# Patient Record
Sex: Female | Born: 1945 | Race: White | Hispanic: No
Health system: Southern US, Community
[De-identification: ages and names within clinical notes are randomized; demographics above are authoritative.]

---

## 2010-01-06 ENCOUNTER — Inpatient Hospital Stay: Admission: RE | Admit: 2010-01-06 | Discharge: 2010-01-27 | Payer: Self-pay | Admitting: Internal Medicine

## 2010-10-24 LAB — COMPREHENSIVE METABOLIC PANEL
ALT: 14 U/L (ref 0–35)
Creatinine, Ser: 0.45 mg/dL (ref 0.4–1.2)
Potassium: 4 mEq/L (ref 3.5–5.1)

## 2010-10-24 LAB — CBC
HCT: 28.9 % — ABNORMAL LOW (ref 36.0–46.0)
MCHC: 32.9 g/dL (ref 30.0–36.0)
MCV: 94.3 fL (ref 78.0–100.0)
RDW: 18.6 % — ABNORMAL HIGH (ref 11.5–15.5)

## 2010-10-24 LAB — BASIC METABOLIC PANEL
BUN: 17 mg/dL (ref 6–23)
Calcium: 9.7 mg/dL (ref 8.4–10.5)
GFR calc Af Amer: >60 mL/min (ref >60)
GFR calc non Af Amer: >60 mL/min (ref >60)
Sodium: 135 mEq/L (ref 135–145)

## 2010-10-24 LAB — PHOSPHORUS: Phosphorus: 4.6 mg/dL (ref 2.3–4.6)

## 2010-10-24 LAB — THEOPHYLLINE LEVEL: Theophylline Lvl: 7.6 ug/mL — ABNORMAL LOW (ref 10.0–20.0)

## 2010-10-24 LAB — DIFFERENTIAL
Eosinophils Absolute: 0.1 10*3/uL (ref 0.0–0.7)
Lymphs Abs: 1 10*3/uL (ref 0.7–4.0)
Monocytes Absolute: 0.6 10*3/uL (ref 0.1–1.0)
Neutrophils Relative %: 60 % (ref 43–77)

## 2010-10-25 LAB — DIFFERENTIAL
Basophils Absolute: 0 10*3/uL (ref 0.0–0.1)
Eosinophils Relative: 0 % (ref 0–5)
Lymphocytes Relative: 2 % — ABNORMAL LOW (ref 12–46)
Lymphocytes Relative: 9 % — ABNORMAL LOW (ref 12–46)
Lymphs Abs: 0.8 10*3/uL (ref 0.7–4.0)
Monocytes Relative: 3 % (ref 3–12)
Neutrophils Relative %: 75 % (ref 43–77)

## 2010-10-25 LAB — CBC
Hemoglobin: 9.2 g/dL — ABNORMAL LOW (ref 12.0–15.0)
MCHC: 32.6 g/dL (ref 30.0–36.0)
MCHC: 33.1 g/dL (ref 30.0–36.0)
MCV: 90.3 fL (ref 78.0–100.0)
MCV: 90.3 fL (ref 78.0–100.0)
Platelets: 417 10*3/uL — ABNORMAL HIGH (ref 150–400)
RBC: 3.1 MIL/uL — ABNORMAL LOW (ref 3.87–5.11)
RBC: 3.56 MIL/uL — ABNORMAL LOW (ref 3.87–5.11)
WBC: 16.1 10*3/uL — ABNORMAL HIGH (ref 4.0–10.5)

## 2010-10-25 LAB — BLOOD GAS, ARTERIAL
Bicarbonate: 39.9 mEq/L — ABNORMAL HIGH (ref 20.0–24.0)
O2 Content: 2.5 L/min
O2 Saturation: 95.5 %
Patient temperature: 98.6
TCO2: 41.8 mmol/L (ref 0–100)
pCO2 arterial: 62.9 mmHg (ref 35.0–45.0)

## 2010-10-25 LAB — COMPREHENSIVE METABOLIC PANEL
AST: 20 U/L (ref 0–37)
Alkaline Phosphatase: 38 U/L — ABNORMAL LOW (ref 39–117)
CO2: 39 mEq/L — ABNORMAL HIGH (ref 19–32)
CO2: 40 mEq/L — ABNORMAL HIGH (ref 19–32)
Calcium: 8.4 mg/dL (ref 8.4–10.5)
Calcium: 9.2 mg/dL (ref 8.4–10.5)
Chloride: 88 mEq/L — ABNORMAL LOW (ref 96–112)
Creatinine, Ser: 0.3 mg/dL — ABNORMAL LOW (ref 0.4–1.2)
Creatinine, Ser: 0.38 mg/dL — ABNORMAL LOW (ref 0.4–1.2)
GFR calc non Af Amer: >60 mL/min (ref >60)
GFR calc non Af Amer: >60 mL/min (ref >60)
Glucose, Bld: 129 mg/dL — ABNORMAL HIGH (ref 70–99)
Glucose, Bld: 95 mg/dL (ref 70–99)
Sodium: 132 mEq/L — ABNORMAL LOW (ref 135–145)

## 2010-10-25 LAB — CREATININE, SERUM: Creatinine, Ser: <0.3 mg/dL — ABNORMAL LOW (ref 0.4–1.2)

## 2010-10-25 LAB — BASIC METABOLIC PANEL
BUN: 14 mg/dL (ref 6–23)
CO2: 35 mEq/L — ABNORMAL HIGH (ref 19–32)
Chloride: 90 mEq/L — ABNORMAL LOW (ref 96–112)
Creatinine, Ser: 0.36 mg/dL — ABNORMAL LOW (ref 0.4–1.2)
GFR calc Af Amer: >60 mL/min (ref >60)

## 2010-10-25 LAB — RETICULOCYTES: RBC.: 3.71 MIL/uL — ABNORMAL LOW (ref 3.87–5.11)

## 2010-10-25 LAB — IRON AND TIBC
Saturation Ratios: 18 % — ABNORMAL LOW (ref 20–55)
TIBC: 298 ug/dL (ref 250–470)

## 2010-10-25 LAB — VITAMIN D 1,25 DIHYDROXY: Vitamin D2 1, 25 (OH)2: <8 pg/mL

## 2010-10-25 LAB — MAGNESIUM: Magnesium: 1.6 mg/dL (ref 1.5–2.5)

## 2010-10-25 LAB — NASAL CULTURE (N/P): Culture: NORMAL

## 2010-10-25 LAB — PREALBUMIN: Prealbumin: 19 mg/dL (ref 18.0–45.0)

## 2010-10-25 LAB — VITAMIN B12: Vitamin B-12: 968 pg/mL — ABNORMAL HIGH (ref 211–911)

## 2011-10-31 ENCOUNTER — Inpatient Hospital Stay: Admission: AD | Admit: 2011-10-31 | Payer: Self-pay | Source: Ambulatory Visit | Admitting: Internal Medicine

## 2012-09-26 ENCOUNTER — Inpatient Hospital Stay
Admission: AD | Admit: 2012-09-26 | Discharge: 2012-11-07 | Disposition: A | Discharge status: Skilled Nursing Facility | Payer: Self-pay | Source: Ambulatory Visit | Attending: Internal Medicine | Admitting: Internal Medicine

## 2012-09-26 DIAGNOSIS — J189 Pneumonia, unspecified organism: Secondary | ICD-10-CM

## 2012-09-26 DIAGNOSIS — J439 Emphysema, unspecified: Secondary | ICD-10-CM

## 2012-09-26 DIAGNOSIS — J9620 Acute and chronic respiratory failure, unspecified whether with hypoxia or hypercapnia: Secondary | ICD-10-CM

## 2012-09-26 DIAGNOSIS — J9801 Acute bronchospasm: Secondary | ICD-10-CM

## 2012-09-26 DIAGNOSIS — Z930 Tracheostomy status: Secondary | ICD-10-CM

## 2012-09-26 DIAGNOSIS — R64 Cachexia: Secondary | ICD-10-CM

## 2012-09-27 LAB — COMPREHENSIVE METABOLIC PANEL
ALT: 9 U/L (ref 0–35)
AST: 9 U/L (ref 0–37)
Albumin: 3 g/dL — ABNORMAL LOW (ref 3.5–5.2)
Alkaline Phosphatase: 56 U/L (ref 39–117)
BUN: 16 mg/dL (ref 6–23)
Chloride: 97 mEq/L (ref 96–112)
Potassium: 3.9 mEq/L (ref 3.5–5.1)
Sodium: 144 mEq/L (ref 135–145)
Total Bilirubin: 0.2 mg/dL — ABNORMAL LOW (ref 0.3–1.2)

## 2012-09-27 LAB — SEDIMENTATION RATE: Sed Rate: 30 mm/hr — ABNORMAL HIGH (ref 0–22)

## 2012-09-27 LAB — CBC
HCT: 39.1 % (ref 36.0–46.0)
Platelets: 327 10*3/uL (ref 150–400)
RDW: 14.3 % (ref 11.5–15.5)
WBC: 10.6 10*3/uL — ABNORMAL HIGH (ref 4.0–10.5)

## 2012-09-27 LAB — PREALBUMIN: Prealbumin: 17 mg/dL — ABNORMAL LOW (ref 17.0–34.0)

## 2012-09-28 ENCOUNTER — Other Ambulatory Visit (HOSPITAL_COMMUNITY): Payer: Self-pay

## 2012-09-29 LAB — CBC
HCT: 38.9 % (ref 36.0–46.0)
MCHC: 29.6 g/dL — ABNORMAL LOW (ref 30.0–36.0)
MCV: 99.7 fL (ref 78.0–100.0)
RDW: 14.4 % (ref 11.5–15.5)

## 2012-09-29 LAB — BASIC METABOLIC PANEL
BUN: 12 mg/dL (ref 6–23)
Calcium: 9.6 mg/dL (ref 8.4–10.5)
Creatinine, Ser: 0.26 mg/dL — ABNORMAL LOW (ref 0.50–1.10)
GFR calc Af Amer: >90 mL/min (ref >90)
GFR calc non Af Amer: >90 mL/min (ref >90)

## 2012-09-30 LAB — BASIC METABOLIC PANEL
Calcium: 9.3 mg/dL (ref 8.4–10.5)
Creatinine, Ser: 0.33 mg/dL — ABNORMAL LOW (ref 0.50–1.10)
GFR calc Af Amer: >90 mL/min (ref >90)

## 2012-09-30 LAB — PROCALCITONIN: Procalcitonin: <0.1 ng/mL

## 2012-10-01 LAB — BASIC METABOLIC PANEL
CO2: 39 mEq/L — ABNORMAL HIGH (ref 19–32)
Calcium: 9.4 mg/dL (ref 8.4–10.5)
Creatinine, Ser: 0.33 mg/dL — ABNORMAL LOW (ref 0.50–1.10)
GFR calc Af Amer: >90 mL/min (ref >90)

## 2012-10-01 LAB — CBC
MCV: 101.3 fL — ABNORMAL HIGH (ref 78.0–100.0)
Platelets: 330 10*3/uL (ref 150–400)
RDW: 14.6 % (ref 11.5–15.5)
WBC: 9.8 10*3/uL (ref 4.0–10.5)

## 2012-10-03 ENCOUNTER — Other Ambulatory Visit (HOSPITAL_COMMUNITY): Payer: Self-pay

## 2012-10-03 DIAGNOSIS — J438 Other emphysema: Secondary | ICD-10-CM

## 2012-10-03 DIAGNOSIS — J9801 Acute bronchospasm: Secondary | ICD-10-CM

## 2012-10-03 DIAGNOSIS — R64 Cachexia: Secondary | ICD-10-CM

## 2012-10-03 DIAGNOSIS — J9620 Acute and chronic respiratory failure, unspecified whether with hypoxia or hypercapnia: Secondary | ICD-10-CM | POA: Diagnosis present

## 2012-10-03 DIAGNOSIS — J189 Pneumonia, unspecified organism: Secondary | ICD-10-CM

## 2012-10-03 DIAGNOSIS — J439 Emphysema, unspecified: Secondary | ICD-10-CM | POA: Diagnosis present

## 2012-10-03 LAB — BASIC METABOLIC PANEL
CO2: 38 mEq/L — ABNORMAL HIGH (ref 19–32)
Calcium: 9.1 mg/dL (ref 8.4–10.5)
GFR calc Af Amer: 82 mL/min — ABNORMAL LOW (ref >90)
GFR calc non Af Amer: 71 mL/min — ABNORMAL LOW (ref >90)
Sodium: 134 mEq/L — ABNORMAL LOW (ref 135–145)

## 2012-10-03 LAB — DIFFERENTIAL
Eosinophils Relative: 0 % (ref 0–5)
Lymphocytes Relative: 2 % — ABNORMAL LOW (ref 12–46)
Monocytes Relative: 5 % (ref 3–12)
Neutrophils Relative %: 93 % — ABNORMAL HIGH (ref 43–77)
WBC Morphology: INCREASED

## 2012-10-03 LAB — BLOOD GAS, ARTERIAL
Acid-Base Excess: 1.9 mmol/L (ref 0.0–2.0)
Bicarbonate: 29.6 mEq/L — ABNORMAL HIGH (ref 20.0–24.0)
Bicarbonate: 27.4 mEq/L — ABNORMAL HIGH (ref 20.0–24.0)
FIO2: 100 %
FIO2: 0.4 %
MECHVT: 400 mL
O2 Saturation: 99.7 %
PEEP: 5 cmH2O
Patient temperature: 98.6
Patient temperature: 98.6
TCO2: 40.3 mmol/L (ref 0–100)
TCO2: 32.1 mmol/L (ref 0–100)
pCO2 arterial: 52.3 mmHg — ABNORMAL HIGH (ref 35.0–45.0)
pH, Arterial: 7.043 — CL (ref 7.350–7.450)
pO2, Arterial: 123 mmHg — ABNORMAL HIGH (ref 80.0–100.0)

## 2012-10-03 LAB — CBC
Hemoglobin: 11.5 g/dL — ABNORMAL LOW (ref 12.0–15.0)
MCHC: 29.3 g/dL — ABNORMAL LOW (ref 30.0–36.0)
MCV: 103.1 fL — ABNORMAL HIGH (ref 78.0–100.0)
Platelets: 291 10*3/uL (ref 150–400)
Platelets: 302 10*3/uL (ref 150–400)
RBC: 3.85 MIL/uL — ABNORMAL LOW (ref 3.87–5.11)
WBC: 42.4 10*3/uL — ABNORMAL HIGH (ref 4.0–10.5)

## 2012-10-03 LAB — TSH: TSH: 0.972 u[IU]/mL (ref 0.350–4.500)

## 2012-10-03 LAB — VITAMIN B12: Vitamin B-12: 1179 pg/mL — ABNORMAL HIGH (ref 211–911)

## 2012-10-03 LAB — T4, FREE: Free T4: 0.96 ng/dL (ref 0.80–1.80)

## 2012-10-03 NOTE — Consult Note (Signed)
PULMONARY/CCM NOTE  Requesting MD/Service: Hijazi Date of admission: 09/26/12 Date of consult: 10/03/12 Reason for consultation: acute on chronic respiratory failure  Brief profile: 58F with severe COPD intubated 2/26 for severe COPD with bronchospasm, new RLL HCAP with copious purulent sputum, severe hypercarbia and marked leukocytosis.    Lines, Tubes, etc: ETT 2/26 >>   Microbiology: Respiratory 2/26 >>  Blood 2/26 >>   Antibiotics:  Vanc 2/26 >>  Meropenem 2/26 >>     Best Practice: DVT: LMWH SUP: PPI Sedation/analgesia: intermittent Ativan and fentanyl    HPI:  58F with severe COPD transferred to [facility] 2/19 after hospitalization 2/10-2/19 in [place] for acute on chronic respiratory failure due to AECOPD which required intubation. This was her 3rd intubation in 3 months for the same. She was extubated prior to transfer to [facility]. During the week in this hospital, she was undergoing rehab and ambulating well. On the morning of this consultation, she was found minimally responsive with severe hypercarbia requiring intubation    PMH: Htn, GERD, anxiety, hyperlipidemia, COPD   MEDICATIONS: reviewed  SH:  100+ PY smoker. Reportedly quit in recent past. No occupational exposures of note  FH: noncontributory  ROS - N/A due to encephalopathy  Vitals reviewed  EXAM:  Gen: frail, thin to cachectic HEENT: [place]/AT, EOMI, PERRL Neck: NO JVD noted Lungs: markedly diminished BS throughout, prolonged expiratory wheezes, findings of consolidation in RLL Cardiovascular: RRR s M Abdomen: soft, nondistended, nontender EXT: Warm, 2+ DP pulses, no edema Neuro: MAEs, no focal deficits  DATA:  CXR: New R > L AS dz  BMET    Component Value Date/Time   NA 134* 10/03/2012 1000   K 6.0* 10/03/2012 1000   CL 91* 10/03/2012 1000   CO2 38* 10/03/2012 1000   GLUCOSE 146* 10/03/2012 1000   BUN 33* 10/03/2012 1000   CREATININE 0.85 10/03/2012 1000   CALCIUM 9.1 10/03/2012 1000   GFRNONAA 71* 10/03/2012 1000   GFRAA 82* 10/03/2012 1000    CBC    Component Value Date/Time   WBC 42.4* 10/03/2012 1000   RBC 3.85* 10/03/2012 1000   HGB 11.5* 10/03/2012 1000   HCT 39.7 10/03/2012 1000   PLT 302 10/03/2012 1000   MCV 103.1* 10/03/2012 1000   MCH 29.9 10/03/2012 1000   MCHC 29.0* 10/03/2012 1000   RDW 14.0 10/03/2012 1000   LYMPHSABS 0.8 10/03/2012 1000   MONOABS 2.1* 10/03/2012 1000   EOSABS 0.0 10/03/2012 1000   BASOSABS 0.0 10/03/2012 1000       IMPRESSION:   Principal Problem:   Acute-on-chronic respiratory failure Active Problems:   Emphysema/COPD, severe   HCAP, RLL   Bronchospasm   Cachexia   PLAN:  Vent settings established Bronchodilators adjusted Agree with Vanc/meropenem Resp culture ordered She will need trach tube but needs to be better stabilized before proceeding  Have discussed with Dr Nat Math who will perform 10/08/12  Please discuss with family and obtain consent  40 mins CCM time  Billy Fischer, MD ; [facility] service Mobile [phone].  After 5:30 PM or weekends, call [phone]

## 2012-10-03 NOTE — Procedures (Signed)
Intubation Procedure Note Linda Alvarado [id number] Jul 21, 1946  Procedure: Intubation Indications: Respiratory insufficiency  Procedure Details Consent: Unable to obtain consent because of emergent medical necessity. Time Out: Verified patient identification, verified procedure, site/side was marked, verified correct patient position, special equipment/implants available, medications/allergies/relevent history reviewed, required imaging and test results available.  Performed  MAC #3 Medications:    Etomidate 20 mg Vecuronium 8 mg NMB    Evaluation Hemodynamic Status: BP stable throughout; O2 sats: stable throughout Patient's Current Condition: stable Complications: No apparent complications Patient did tolerate procedure well. Chest X-ray ordered to verify placement.  CXR: pending.  Procedure performed by Dr. Billy Fischer  Kandice Robinsons, RN ACNP Student [employer] for Devra Dopp, ACNP   Brett Canales Minor ACNP [facility] PCCM Pager [phone] till 3 pm If no answer page [phone] 10/03/2012, 11:45 AM  I was present for and supervised the entire procedure  Billy Fischer, MD ; [facility] service Mobile [phone].  After 5:30 PM or weekends, call [phone]

## 2012-10-04 ENCOUNTER — Other Ambulatory Visit (HOSPITAL_COMMUNITY): Payer: Self-pay

## 2012-10-04 LAB — BLOOD GAS, ARTERIAL
Acid-Base Excess: 0.1 mmol/L (ref 0.0–2.0)
Bicarbonate: 22.8 mEq/L (ref 20.0–24.0)
Bicarbonate: 24.6 mEq/L — ABNORMAL HIGH (ref 20.0–24.0)
FIO2: 0.3 %
MECHVT: 400 mL
PEEP: 5 cmH2O
Patient temperature: 98.6
Patient temperature: 98.6
TCO2: 24 mmol/L (ref 0–100)
TCO2: 26 mmol/L (ref 0–100)
pCO2 arterial: 39.4 mmHg (ref 35.0–45.0)
pCO2 arterial: 43.1 mmHg (ref 35.0–45.0)
pH, Arterial: 7.381 (ref 7.350–7.450)
pH, Arterial: 7.375 (ref 7.350–7.450)

## 2012-10-04 LAB — COMPREHENSIVE METABOLIC PANEL
ALT: 12 U/L (ref 0–35)
AST: 18 U/L (ref 0–37)
Alkaline Phosphatase: 92 U/L (ref 39–117)
CO2: 25 mEq/L (ref 19–32)
Calcium: 7.2 mg/dL — ABNORMAL LOW (ref 8.4–10.5)
Chloride: 100 mEq/L (ref 96–112)
GFR calc Af Amer: 86 mL/min — ABNORMAL LOW (ref >90)
GFR calc non Af Amer: 74 mL/min — ABNORMAL LOW (ref >90)
Glucose, Bld: 125 mg/dL — ABNORMAL HIGH (ref 70–99)
Potassium: 4.2 mEq/L (ref 3.5–5.1)
Sodium: 135 mEq/L (ref 135–145)
Total Bilirubin: 0.3 mg/dL (ref 0.3–1.2)

## 2012-10-04 LAB — URINE CULTURE
Colony Count: NO GROWTH
Culture: NO GROWTH

## 2012-10-04 LAB — CBC
Hemoglobin: 9.2 g/dL — ABNORMAL LOW (ref 12.0–15.0)
MCH: 30.3 pg (ref 26.0–34.0)
Platelets: 236 10*3/uL (ref 150–400)
RBC: 3.04 MIL/uL — ABNORMAL LOW (ref 3.87–5.11)
WBC: 28.8 10*3/uL — ABNORMAL HIGH (ref 4.0–10.5)

## 2012-10-04 NOTE — Consult Note (Signed)
PULMONARY/CCM NOTE  Requesting MD/Service: Hijazi Date of admission: 09/26/12 Date of consult: 10/03/12 Reason for consultation: acute on chronic respiratory failure  Brief profile: 86F with severe COPD intubated 2/26 for severe COPD with bronchospasm, new RLL HCAP with copious purulent sputum, severe hypercarbia and marked leukocytosis.   Lines/Tubes: ETT 2/26 >>   Microbiology: Respiratory 2/26 >>  Blood 2/26 >>   Antibiotics:  Per primary team  Vitals reviewed in bedside chart. FiO2 30%, PEEP 5, Levophed 8 mcg on 2/27  EXAM:  Gen: No distress HEENT: ETT in place Lungs: Prolonged exhalation, decreased breath sounds, no wheeze, rales Rt base Cardiovascular: s1s2 regular, tachycardic Abdomen: soft, non tender EXT: no edema Neuro: sedated   BMET Lab Results  Component Value Date   CREATININE 0.82 10/04/2012   BUN 38* 10/04/2012   NA 135 10/04/2012   K 4.2 10/04/2012   CL 100 10/04/2012   CO2 25 10/04/2012     CBC Lab Results  Component Value Date   WBC 28.8* 10/04/2012   HGB 9.2* 10/04/2012   HCT 29.4* 10/04/2012   MCV 96.7 10/04/2012   PLT 236 10/04/2012   ABG    Component Value Date/Time   PHART 7.375 10/04/2012 0940   PCO2ART 43.1 10/04/2012 0940   PO2ART 87.8 10/04/2012 0940   HCO3 24.6* 10/04/2012 0940   TCO2 26.0 10/04/2012 0940   ACIDBASEDEF 1.6 10/04/2012 0429   O2SAT 99.0 10/04/2012 0940   Imaging:   Dg Chest Port 1 View  10/04/2012  *RADIOLOGY REPORT*  Clinical Data: Respiratory failure, follow-up  PORTABLE CHEST - 1 VIEW  Comparison: Portable exam 0716 hours compared to 10/03/2012  Findings: Tip of endotracheal tube approximately 5.2 cm above carina. Nasogastric tube extends into stomach. Right arm PICC line tip projects over cavoatrial junction. Numerous cardiac monitoring leads project over chest.  Normal heart size, mediastinal contours, and pulmonary vascularity. Bibasilar airspace disease unchanged. Upper lungs grossly clear. Minimal right pleural  effusion. No pneumothorax or acute osseous findings.  IMPRESSION: Persistent bibasilar infiltrates consistent with pneumonia.   Original Report Authenticated By: Ulyses Southward, M.D.    Dg Chest Port 1 View  10/03/2012  *RADIOLOGY REPORT*  Clinical Data: Status post intubation.  PORTABLE CHEST - 1 VIEW  Comparison: Single view of the chest 09/28/2012.  Findings: Endotracheal tube is in place with the tip at the carina. Recommend withdrawal of 3 cm.  There is right worse than left basilar airspace disease.  The lungs appear emphysematous.  Heart size is normal.  Right PICC is unchanged.  IMPRESSION:  1.  Endotracheal tube tip is near the carina.  The tube should withdrawn 3 cm. 2.  Bibasilar airspace disease likely due to pneumonia.   Original Report Authenticated By: Holley Dexter, M.D.    Dg Abd Portable 1v  10/03/2012  *RADIOLOGY REPORT*  Clinical Data: NG tube placement.  PORTABLE ABDOMEN - 1 VIEW  Comparison: Chest x-ray of the same day.  Findings: The sideport of an NG tube is at or just beyond the GE junction.  The right-sided PICC line is stable.  Bibasilar pneumonia is evident.  Bowel gas pattern is unremarkable.  There is no evidence for obstruction or free air.  IMPRESSION:  1.  Side port of the NG tube is at or just below the GE junction. 2.  Bibasilar pneumonia.   Original Report Authenticated By: Marin Roberts, M.D.     PLAN:  Acute on chronic respiratory failure from HCAP in setting of severe COPD.  Has  multiple prior intubations. P: Wean off prednisone as tolerated Full vent support until more stable Abx/pressors per primary team Continue scheduled duoneb, pulmicort D/c performist F/u CXR ABG as needed F/u Blood/sputum cx's Will need trach >> tentative plan for 10/08/12  CC time 35 minutes.  Coralyn Helling, MD [facility] Pulmonary/Critical Care 10/04/2012, 12:12 PM Pager:  [phone] After 3pm call: [phone]

## 2012-10-05 ENCOUNTER — Other Ambulatory Visit (HOSPITAL_COMMUNITY): Payer: Self-pay

## 2012-10-05 LAB — BASIC METABOLIC PANEL
CO2: 26 mEq/L (ref 19–32)
Chloride: 103 mEq/L (ref 96–112)
Glucose, Bld: 130 mg/dL — ABNORMAL HIGH (ref 70–99)
Sodium: 137 mEq/L (ref 135–145)

## 2012-10-05 LAB — CBC
HCT: 26.2 % — ABNORMAL LOW (ref 36.0–46.0)
MCH: 30.4 pg (ref 26.0–34.0)
MCV: 93.6 fL (ref 78.0–100.0)
RBC: 2.8 MIL/uL — ABNORMAL LOW (ref 3.87–5.11)
WBC: 24.3 10*3/uL — ABNORMAL HIGH (ref 4.0–10.5)

## 2012-10-05 NOTE — Progress Notes (Addendum)
PULMONARY/CCM NOTE  Requesting MD/Service: Hijazi Date of admission: 09/26/12 Date of consult: 10/03/12 Reason for consultation: acute on chronic respiratory failure  Brief profile: 56F with severe COPD intubated 2/26 for severe COPD with bronchospasm, new RLL HCAP with copious purulent sputum, severe hypercarbia and marked leukocytosis.   Lines/Tubes: ETT 2/26 >>   Microbiology: Respiratory 2/26 >>  Blood 2/26 >>  Urine 2/26 >> negative  Antibiotics:  Per primary team  Vitals reviewed in bedside chart. FiO2 30%, PEEP 5, Levophed 8 mcg on 2/28  EXAM:  Gen: No distress HEENT: ETT in place Lungs: Prolonged exhalation, decreased breath sounds, Cardiovascular: s1s2 regular, tachycardic Abdomen: soft, non tender EXT: no edema Neuro: follows commands   BMET Lab Results  Component Value Date   CREATININE PENDING 10/05/2012   BUN PENDING 10/05/2012   NA 137 10/05/2012   K 3.2* 10/05/2012   CL 103 10/05/2012   CO2 26 10/05/2012     CBC Lab Results  Component Value Date   WBC 24.3* 10/05/2012   HGB 8.5* 10/05/2012   HCT 26.2* 10/05/2012   MCV 93.6 10/05/2012   PLT 217 10/05/2012   ABG    Component Value Date/Time   PHART 7.375 10/04/2012 0940   PCO2ART 43.1 10/04/2012 0940   PO2ART 87.8 10/04/2012 0940   HCO3 24.6* 10/04/2012 0940   TCO2 26.0 10/04/2012 0940   ACIDBASEDEF 1.6 10/04/2012 0429   O2SAT 99.0 10/04/2012 0940   Imaging:   Dg Chest Port 1 View  10/05/2012  *RADIOLOGY REPORT*  Clinical Data: Pneumonia.  PORTABLE CHEST - 1 VIEW  Comparison: 02/27 and 09/28/2012  Findings: Endotracheal tube, NG tube, and PICC appear in good position.  Small patchy infiltrates at both lung bases persist with slight clearing on the right.  Heart size and vascularity are normal.  IMPRESSION: Bibasilar pneumonia, slightly improved on the right.   Original Report Authenticated By: Francene Boyers, M.D.    Dg Chest Port 1 View  10/04/2012  *RADIOLOGY REPORT*  Clinical Data: Respiratory  failure, follow-up  PORTABLE CHEST - 1 VIEW  Comparison: Portable exam 0716 hours compared to 10/03/2012  Findings: Tip of endotracheal tube approximately 5.2 cm above carina. Nasogastric tube extends into stomach. Right arm PICC line tip projects over cavoatrial junction. Numerous cardiac monitoring leads project over chest.  Normal heart size, mediastinal contours, and pulmonary vascularity. Bibasilar airspace disease unchanged. Upper lungs grossly clear. Minimal right pleural effusion. No pneumothorax or acute osseous findings.  IMPRESSION: Persistent bibasilar infiltrates consistent with pneumonia.   Original Report Authenticated By: Ulyses Southward, M.D.    Dg Chest Port 1 View  10/03/2012  *RADIOLOGY REPORT*  Clinical Data: Status post intubation.  PORTABLE CHEST - 1 VIEW  Comparison: Single view of the chest 09/28/2012.  Findings: Endotracheal tube is in place with the tip at the carina. Recommend withdrawal of 3 cm.  There is right worse than left basilar airspace disease.  The lungs appear emphysematous.  Heart size is normal.  Right PICC is unchanged.  IMPRESSION:  1.  Endotracheal tube tip is near the carina.  The tube should withdrawn 3 cm. 2.  Bibasilar airspace disease likely due to pneumonia.   Original Report Authenticated By: Holley Dexter, M.D.    Dg Abd Portable 1v  10/03/2012  *RADIOLOGY REPORT*  Clinical Data: NG tube placement.  PORTABLE ABDOMEN - 1 VIEW  Comparison: Chest x-ray of the same day.  Findings: The sideport of an NG tube is at or just beyond the GE junction.  The right-sided PICC line is stable.  Bibasilar pneumonia is evident.  Bowel gas pattern is unremarkable.  There is no evidence for obstruction or free air.  IMPRESSION:  1.  Side port of the NG tube is at or just below the GE junction. 2.  Bibasilar pneumonia.   Original Report Authenticated By: Marin Roberts, M.D.     PLAN:  Acute on chronic respiratory failure from HCAP in setting of severe COPD.  Has multiple  prior intubations. P: Wean off prednisone as tolerated Full vent support until more stable Abx/pressors per primary team Continue scheduled duoneb, pulmicort F/u CXR ABG as needed F/u Blood/sputum cx's Will need trach >> tentative plan for 10/08/12 Place CVL 2/28  [facility] Minor ACNP [facility] Pollack PCCM Pager [phone] till 3 pm If no answer page [phone] 10/05/2012, 9:36 AM  Coralyn Helling, MD [facility] Pulmonary/Critical Care 10/05/2012, 11:05 AM Pager:  [phone] After 3pm call: [phone]

## 2012-10-05 NOTE — Procedures (Signed)
Central Venous Catheter Insertion Procedure Note Linda Alvarado [id number] 1945/12/06  Procedure: Insertion of Central Venous Catheter Indications: Drug and/or fluid administration  Procedure Details Consent: Risks of procedure as well as the alternatives and risks of each were explained to the (patient/caregiver).  Consent for procedure obtained. Time Out: Verified patient identification, verified procedure, site/side was marked, verified correct patient position, special equipment/implants available, medications/allergies/relevent history reviewed, required imaging and test results available.  Performed  Maximum sterile technique was used including antiseptics, cap, gloves, gown, hand hygiene, mask and sheet. Skin prep: Chlorhexidine; local anesthetic administered A antimicrobial bonded/coated triple lumen catheter was placed in the left internal jugular vein using the Seldinger technique.  Evaluation Blood flow good Complications: No apparent complications Patient did tolerate procedure well. Chest X-ray ordered to verify placement.  CXR: pending.  Performed by Devra Dopp, ACNP.  I was present for procedure.  Coralyn Helling, MD [facility] Pulmonary/Critical Care 10/05/2012, 11:17 AM Pager:  [phone] After 3pm call: [phone]

## 2012-10-06 LAB — CBC
HCT: 25.9 % — ABNORMAL LOW (ref 36.0–46.0)
Hemoglobin: 8.3 g/dL — ABNORMAL LOW (ref 12.0–15.0)
MCHC: 32 g/dL (ref 30.0–36.0)
MCV: 93.5 fL (ref 78.0–100.0)
RDW: 15.2 % (ref 11.5–15.5)

## 2012-10-06 LAB — BASIC METABOLIC PANEL
BUN: 6 mg/dL (ref 6–23)
CO2: 31 mEq/L (ref 19–32)
GFR calc non Af Amer: >90 mL/min (ref >90)
Glucose, Bld: 118 mg/dL — ABNORMAL HIGH (ref 70–99)
Potassium: 4.6 mEq/L (ref 3.5–5.1)
Sodium: 143 mEq/L (ref 135–145)

## 2012-10-06 LAB — RENAL FUNCTION PANEL
Albumin: 2.2 g/dL — ABNORMAL LOW (ref 3.5–5.2)
BUN: 7 mg/dL (ref 6–23)
Chloride: 104 mEq/L (ref 96–112)
Creatinine, Ser: 0.24 mg/dL — ABNORMAL LOW (ref 0.50–1.10)
Glucose, Bld: 150 mg/dL — ABNORMAL HIGH (ref 70–99)
Phosphorus: 1.1 mg/dL — ABNORMAL LOW (ref 2.3–4.6)
Potassium: 2.9 mEq/L — ABNORMAL LOW (ref 3.5–5.1)

## 2012-10-07 ENCOUNTER — Other Ambulatory Visit (HOSPITAL_COMMUNITY): Payer: Self-pay

## 2012-10-07 LAB — CBC
Hemoglobin: 9 g/dL — ABNORMAL LOW (ref 12.0–15.0)
MCH: 30 pg (ref 26.0–34.0)
MCHC: 31.3 g/dL (ref 30.0–36.0)
MCV: 96 fL (ref 78.0–100.0)
RBC: 3 MIL/uL — ABNORMAL LOW (ref 3.87–5.11)

## 2012-10-07 LAB — BASIC METABOLIC PANEL
BUN: 7 mg/dL (ref 6–23)
CO2: 31 mEq/L (ref 19–32)
Calcium: 7.8 mg/dL — ABNORMAL LOW (ref 8.4–10.5)
Creatinine, Ser: 0.25 mg/dL — ABNORMAL LOW (ref 0.50–1.10)
GFR calc non Af Amer: >90 mL/min (ref >90)
Glucose, Bld: 145 mg/dL — ABNORMAL HIGH (ref 70–99)

## 2012-10-07 MED ORDER — IOHEXOL 350 MG/ML SOLN
80.0000 mL | Freq: Once | INTRAVENOUS | Status: AC | PRN
  Administered 2012-10-07: 80 mL via INTRAVENOUS

## 2012-10-08 ENCOUNTER — Other Ambulatory Visit (HOSPITAL_COMMUNITY): Payer: Self-pay

## 2012-10-08 ENCOUNTER — Encounter (HOSPITAL_COMMUNITY): Payer: Self-pay

## 2012-10-08 LAB — CULTURE, RESPIRATORY W GRAM STAIN

## 2012-10-08 NOTE — Procedures (Signed)
See full dictation in chart  Size 6 placed  In endo Blood loss less 1 cc  Mcarthur Rossetti. Tyson Alias, MD, FACP Pgr: [phone]  Pulmonary & Critical Care

## 2012-10-08 NOTE — Progress Notes (Signed)
PULMONARY/CCM NOTE  Requesting MD/Service: Hijazi Date of admission: 09/26/12 Date of consult: 10/03/12 Reason for consultation: acute on chronic respiratory failure  Brief profile: 82F with severe COPD intubated 2/26 for severe COPD with bronchospasm, new RLL HCAP with copious purulent sputum, severe hypercarbia and marked leukocytosis.   Lines/Tubes: ETT 2/26 >>   Microbiology: Respiratory 2/26 >>   moderate staph aureus Blood 2/26 >>  Urine 2/26 >> negative    Antibiotics:  Per primary team  Vitals reviewed in bedside chart. Temperature 90.8. Pulse of 91. Respirator 18. Blood pressure 118/64. Pulse ox of 90%. Blood sugar 109 mg percent.   SUBJECTIVE/OVERNIGHT/INTERVAL HX 10/08/2012: She is back on the ventilator after being reintubated. She is calm and cooperative. She is due for tracheostomy today  Ventilator settings Assist control, FiO2 40%, PEEP of 5, tidal volume of 400  EXAM:  Gen: No distress frail HEENT: ETT in place Lungs: Prolonged exhalation, decreased breath sounds, synchronous with the ventilator Cardiovascular: s1s2 regular, tachycardic Abdomen: soft, non tender EXT: no edema Neuro: follows commands CAM-ICU negative for delirium and RASS sedation score 0   BMET Lab Results  Component Value Date   CREATININE 0.25* 10/07/2012   BUN 7 10/07/2012   NA 144 10/07/2012   K 3.8 10/07/2012   CL 107 10/07/2012   CO2 31 10/07/2012     CBC Lab Results  Component Value Date   WBC 12.6* 10/07/2012   HGB 9.0* 10/07/2012   HCT 28.8* 10/07/2012   MCV 96.0 10/07/2012   PLT 230 10/07/2012   ABG    Component Value Date/Time   PHART 7.375 10/04/2012 0940   PCO2ART 43.1 10/04/2012 0940   PO2ART 87.8 10/04/2012 0940   HCO3 24.6* 10/04/2012 0940   TCO2 26.0 10/04/2012 0940   ACIDBASEDEF 1.6 10/04/2012 0429   O2SAT 99.0 10/04/2012 0940   Imaging:   Ct Angio Chest Pe W/cm &/or Wo Cm  10/07/2012  *RADIOLOGY REPORT*  Clinical Data: Acute on chronic respiratory failure, evaluate for  PE  CT ANGIOGRAPHY CHEST  Technique:  Multidetector CT imaging of the chest using the standard protocol during bolus administration of intravenous contrast. Multiplanar reconstructed images including MIPs were obtained and reviewed to evaluate the vascular anatomy.  Contrast: 80mL OMNIPAQUE IOHEXOL 350 MG/ML SOLN  Comparison: Chest radiograph dated 10/05/2012  Findings: No evidence of pulmonary embolism.  Patchy right lower lobe opacity, suspicious for pneumonia.  Patchy left basilar opacity, atelectasis versus pneumonia, with trace left pleural effusion.  Mild nodular scarring in the right lung apex (series 6/image 5). Additional patchy/nodular opacities along the right minor fissure (series 6/image 44) and lateral right lower lobe (series 6/image 73), possibly infectious/inflammatory.  Underlying severe emphysematous changes.  No pneumothorax.  Endotracheal tube terminates 5 cm above the carina.  The heart is normal in size.  No pericardial effusion.  Coronary atherosclerosis.  Atherosclerotic calcifications of the aortic arch.  Left IJ venous catheter terminating at the cavoatrial junction. Enteric tube coursing below the diaphragm.  Visualized upper abdomen is otherwise grossly unremarkable.  Mild degenerative changes of the visualized thoracolumbar spine.  IMPRESSION: No evidence of pulmonary embolism.  Suspected right lower lobe pneumonia.  Atelectasis versus pneumonia at the left lung base.  Trace left pleural effusion.  Additional patchy/nodular opacities in the right lung, as described above, possibly infectious/inflammatory.  Consider follow-up CT chest in 3 months to assess for underlying nodules.  Severe emphysematous changes.   Original Report Authenticated By: Charline Bills, M.D.     PLAN:  Acute on chronic respiratory failure from HCAP in setting of severe COPD.  Has multiple prior intubations. - 10/08/2012: Due for tracheostomy due to multiple re intubations P: Wean off prednisone as  tolerated Full vent support until more stable Abx/pressors per primary team Continue scheduled duoneb, pulmicort F/u CXR ABG as needed F/u Blood/sputum cx's Will need trach >> plan for 10/08/12     Dr. Kalman Shan, M.D., F.C.C.P Pulmonary and Critical Care Medicine Staff Physician [facility] [place] Pulmonary and Critical Care Pager: [phone], If no answer or between  15:00h - 7:00h: call [phone]  10/08/2012 10:58 AM

## 2012-10-08 NOTE — Procedures (Signed)
Procedure done by Kreg Shropshire and  Dr Marchelle Gearing. At first bronch was introduce through ET tube and structures of tracheal rings, carina identified for operator of tracheostomy who was Dr Tyson Alias. Light of bronch passed through trachea and skin for indentification of tracheal rings for tracheostomy puncture. After this, under bronchoscopy guidance,  ET tube was pulled back sufficiently and very carefully. The ET tube was  pulled back enough to give room for tracheostomy operator and yet at same time to to ensure a secured airway. After this was accomplished, bronchoscope was withdrawn into the ET tube. After this,  Dr Tyson Alias then performed tracheostomy under video visual provided by flexible video bronchoscopy. Followng introduction of tracheostomy,  the bronchoscope was removed from ET tube and introduced through tracheostomy. Correct position of tracheostomy was ensured, with enough room between carina and distal tracheostomy and no evidence of bleeding. The bronchoscope was then withdrawn. Respiratory therapist was then instructed to remove the ET tube.  Dr Tyson Alias then proceeded to complete the tracheostomy with stay sutures   No complications  Anders Simmonds ACNP-BC [facility] Pager # [phone] OR # [phone] if no answer

## 2012-10-08 NOTE — Procedures (Signed)
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 Linda Alias, MD, FACP Pgr: [phone] [place] Pulmonary & Critical Care

## 2012-10-08 NOTE — Progress Notes (Signed)
  Echocardiogram 2D Echocardiogram has been performed.  Linda Alvarado 10/08/2012, 12:18 PM

## 2012-10-08 NOTE — Procedures (Signed)
Bedside Tracheostomy Insertion Procedure Note   Patient Details:   Name: Linda Alvarado DOB: [birth year] MRN: [id number]  Procedure: Tracheostomy  Pre Procedure Assessment: ET Tube Size:7.5 ET Tube secured at lip (cm):19 Bite block in place: Yes Breath Sounds: Clear  Post Procedure Assessment: There were no vitals taken for this visit. O2 sats: stable throughout Complications: No apparent complications Patient did tolerate procedure well Tracheostomy Brand:Shiley Tracheostomy Style:Cuffed Tracheostomy Size: 6.0 Tracheostomy Secured [id number]:[id number] Tracheostomy Placement Confirmation:Trach cuff visualized and in place and Chest X ray ordered for placement    Leonard Downing 10/08/2012, 1:13 PM

## 2012-10-09 LAB — CULTURE, BLOOD (ROUTINE X 2): Culture: NO GROWTH

## 2012-10-09 LAB — CBC
HCT: 26 % — ABNORMAL LOW (ref 36.0–46.0)
Hemoglobin: 8.2 g/dL — ABNORMAL LOW (ref 12.0–15.0)
MCH: 30.3 pg (ref 26.0–34.0)
MCV: 95.9 fL (ref 78.0–100.0)
Platelets: 273 10*3/uL (ref 150–400)
RBC: 2.71 MIL/uL — ABNORMAL LOW (ref 3.87–5.11)
WBC: 7.3 10*3/uL (ref 4.0–10.5)

## 2012-10-09 LAB — BASIC METABOLIC PANEL
BUN: 6 mg/dL (ref 6–23)
CO2: 33 mEq/L — ABNORMAL HIGH (ref 19–32)
Calcium: 7.3 mg/dL — ABNORMAL LOW (ref 8.4–10.5)
Chloride: 103 mEq/L (ref 96–112)
Creatinine, Ser: 0.24 mg/dL — ABNORMAL LOW (ref 0.50–1.10)
Glucose, Bld: 79 mg/dL (ref 70–99)

## 2012-10-09 NOTE — Procedures (Signed)
Supervised procedure yesterday 10/08/12  Dr. Kalman Shan, M.D., F.C.C.P Pulmonary and Critical Care Medicine Staff Physician [facility] Pulmonary and Critical Care Pager: [phone], If no answer or between  15:00h - 7:00h: call [phone]  10/09/2012 8:29 AM

## 2012-10-10 LAB — VANCOMYCIN, TROUGH: Vancomycin Tr: 13.9 ug/mL (ref 10.0–20.0)

## 2012-10-10 NOTE — H&P (Signed)
Referring Physician:Hijazi([facility]) HPI: Linda Alvarado is an 67 y.o. female with severe COPD transferred to [facility] 2/19 after hospitalization 2/10-2/19 in [place] for acute on chronic respiratory failure due to AECOPD which required intubation. This was her 3rd intubation in 3 months for the same. She was extubated prior to transfer to [facility]  She has been reintubtaed and now subsequently has had a tracheostomy placed. She has chronic dysphagia and protein cal malnutrition. She can swallow some but has only gotten to a Dys II diet. She needs chronic TF for improved nutrition. She is currently receiving TF via NGT and is tolerating this well. IR is requested to place G-tube for continue enteral TF needs. PMHx and meds reviewed.   Past Medical History: COPD, resp failure  Past Surgical History: Trach  Allergies:  Allergies  Allergen Reactions  . Amoxicillin   . Clarithromycin     Medications: Meds reviewed, getting daily Lovenox. Also on TID Meropenem IV    Please HPI for pertinent positives, otherwise complete 10 system ROS negative.  Physical Exam: Temp 98.2, HR: 88, BP 110/70, RR: 18   General Appearance:  Alert, cooperative, no distress, appears stated age  Head:  Normocephalic, without obvious abnormality, atraumatic  ENT: Unremarkable  Neck: Supple, symmetrical, trach site clean, intact  Lungs:   Clear to auscultation bilaterally, no w/r/r, respirations unlabored without use of accessory muscles.  Heart:  Regular rate and rhythm, S1, S2 normal, no murmur, rub or gallop. Carotids 2+ without bruit.  Abdomen:   Soft, non-tender, non distended. Bowel sounds active all four quadrants,  no masses, no organomegaly.  Neurologic: Normal affect, no gross deficits.   Results for orders placed during the hospital encounter of 09/26/12 (from the past 48 hour(s))  CBC     Status: Abnormal   Collection Time    10/09/12  5:40 AM      Result Value Range   WBC 7.3  4.0 - 10.5 K/uL   RBC 2.71  (*) 3.87 - 5.11 MIL/uL   Hemoglobin 8.2 (*) 12.0 - 15.0 g/dL   HCT 16.1 (*) 09.6 - 04.5 %   MCV 95.9  78.0 - 100.0 fL   MCH 30.3  26.0 - 34.0 pg   MCHC 31.5  30.0 - 36.0 g/dL   RDW 40.9  81.1 - 91.4 %   Platelets 273  150 - 400 K/uL  BASIC METABOLIC PANEL     Status: Abnormal   Collection Time    10/09/12  5:40 AM      Result Value Range   Sodium 142  135 - 145 mEq/L   Potassium 3.3 (*) 3.5 - 5.1 mEq/L   Chloride 103  96 - 112 mEq/L   CO2 33 (*) 19 - 32 mEq/L   Glucose, Bld 79  70 - 99 mg/dL   BUN 6  6 - 23 mg/dL   Creatinine, Ser 7.82 (*) 0.50 - 1.10 mg/dL   Calcium 7.3 (*) 8.4 - 10.5 mg/dL   GFR calc non Af Amer >90  >90 mL/min   GFR calc Af Amer >90  >90 mL/min   Comment:            The eGFR has been calculated     using the CKD EPI equation.     This calculation has not been     validated in all clinical     situations.     eGFR's persistently     <90 mL/min signify     possible Chronic  Kidney Disease.  VANCOMYCIN, TROUGH     Status: None   Collection Time    10/09/12  5:30 PM      Result Value Range   Vancomycin Tr 14.4  10.0 - 20.0 ug/mL  POTASSIUM     Status: None   Collection Time    10/10/12  4:50 AM      Result Value Range   Potassium 4.6  3.5 - 5.1 mEq/L   Comment: DELTA CHECK NOTED     NO VISIBLE HEMOLYSIS   Prothrombin Time/INR 0.92   Assessment/Plan COPD, Resp failure s/p Trach Dysphagia, PCM Discussed need for G-tube, discussed IR technique for placement. Explained procedure risks, complications. On q8h Meropenem Labs reviewed. Consent signed in chart  Brayton El PA-C 10/10/2012, 2:27 PM

## 2012-10-10 NOTE — Progress Notes (Signed)
PULMONARY/CCM NOTE  Requesting MD/Service: Hijazi Date of admission: 09/26/12 Date of consult: 10/03/12 Reason for consultation: acute on chronic respiratory failure  Brief profile: 88F with severe COPD intubated 2/26 for severe COPD with bronchospasm, new RLL HCAP with copious purulent sputum, severe hypercarbia and marked leukocytosis.   Lines/Tubes: ETT 2/26 >> 3/3 Trach 3/3 (feinstein)>>>  Microbiology: Respiratory 2/26 >>   moderate staph aureus Blood 2/26 >>  Urine 2/26 >> negative    Antibiotics:  Per primary team  Vitals reviewed in bedside chart. Temperature 90.8. Pulse of 91. Respirator 18. Blood pressure 118/64. Pulse ox of 90%. Blood sugar 109 mg percent.   SUBJECTIVE/OVERNIGHT/INTERVAL HX No distress on PSV Objective Afebrile, vvs sats 98%  EXAM:  Gen: No distress frail HEENT: ETT in place Lungs: Prolonged exhalation, decreased breath sounds, synchronous with the ventilator Cardiovascular: s1s2 regular, tachycardic Abdomen: soft, non tender EXT: no edema Neuro: follows commands CAM-ICU negative for delirium and RASS sedation score 0   BMET Lab Results  Component Value Date   CREATININE 0.24* 10/09/2012   BUN 6 10/09/2012   NA 142 10/09/2012   K 4.6 10/10/2012   CL 103 10/09/2012   CO2 33* 10/09/2012     CBC Lab Results  Component Value Date   WBC 7.3 10/09/2012   HGB 8.2* 10/09/2012   HCT 26.0* 10/09/2012   MCV 95.9 10/09/2012   PLT 273 10/09/2012   ABG    Component Value Date/Time   PHART 7.375 10/04/2012 0940   PCO2ART 43.1 10/04/2012 0940   PO2ART 87.8 10/04/2012 0940   HCO3 24.6* 10/04/2012 0940   TCO2 26.0 10/04/2012 0940   ACIDBASEDEF 1.6 10/04/2012 0429   O2SAT 99.0 10/04/2012 0940   Imaging:   Dg Chest Port 1 View  10/08/2012  *RADIOLOGY REPORT*  Clinical Data: Status post tracheostomy tube placement.  PORTABLE CHEST - 1 VIEW  Comparison: CT chest 10/07/2012 and plain film chest 10/05/2012.  Findings: The patient has a new tracheostomy tube in place  with the tip in good position at the level of the clavicular heads.  Left IJ catheter and NG tube remain in place.  Right PICC seen on the prior plain film has been removed.  The lungs are emphysematous with bibasilar airspace disease, worse on the right.  Aeration in the bases appears slightly improved since the prior chest film.  Heart size is normal.  No pneumothorax.  IMPRESSION:  1.  Tracheostomy tube in good position without evidence of complication. 2.  Some improvement in bibasilar airspace disease. 3.  Emphysema.   Original Report Authenticated By: Holley Dexter, M.D.     PLAN:  Acute on chronic respiratory failure from HCAP in setting of severe COPD.  Has multiple prior intubations.  s/p trach 3/3 Looks very comfortable on PSV. Should be able to get her off vent.  P: Wean off prednisone as tolerated Full vent support until more stable Abx/pressors per primary team Continue scheduled duoneb, pulmicort Not a candidate for decannulation    Anders Simmonds nurse practitioner 10/10/2012 9:37 AM   STAFF NOTE: I, Dr Lavinia Sharps have personally reviewed patient's available data, including medical history, events of note, physical examination and test results as part of my evaluation. I have discussed with resident/NP and other care providers such as pharmacist, RN and RRT.  In addition,  I personally evaluated patient and elicited Jock findings of acute on chronic respiratory failure. Status post tracheostomy in the setting of COPD and hospital-acquired pneumonia and multiple intubations. Agree  with LTAC weaning protocol.  Rest per NP/medical resident whose note is outlined above and that I agree with      Dr. Kalman Shan, M.D., [facility].C.P Pulmonary and Critical Care Medicine Staff Physician [facility] [place] [facility] Pager: [phone], If no answer or between  15:00h - 7:00h: call [phone]  10/10/2012 3:36 PM

## 2012-10-11 ENCOUNTER — Other Ambulatory Visit (HOSPITAL_COMMUNITY): Payer: Self-pay

## 2012-10-11 LAB — COMPREHENSIVE METABOLIC PANEL
AST: 14 U/L (ref 0–37)
Albumin: 2.3 g/dL — ABNORMAL LOW (ref 3.5–5.2)
Alkaline Phosphatase: 70 U/L (ref 39–117)
BUN: 7 mg/dL (ref 6–23)
CO2: 32 mEq/L (ref 19–32)
Chloride: 100 mEq/L (ref 96–112)
Creatinine, Ser: 0.26 mg/dL — ABNORMAL LOW (ref 0.50–1.10)
GFR calc non Af Amer: >90 mL/min (ref >90)
Potassium: 4.3 mEq/L (ref 3.5–5.1)
Total Bilirubin: 0.2 mg/dL — ABNORMAL LOW (ref 0.3–1.2)

## 2012-10-11 LAB — CBC
HCT: 25 % — ABNORMAL LOW (ref 36.0–46.0)
MCV: 95.1 fL (ref 78.0–100.0)
Platelets: 489 10*3/uL — ABNORMAL HIGH (ref 150–400)
RBC: 2.63 MIL/uL — ABNORMAL LOW (ref 3.87–5.11)
RDW: 15.5 % (ref 11.5–15.5)
WBC: 8.8 10*3/uL (ref 4.0–10.5)

## 2012-10-11 LAB — PROTIME-INR: INR: 1 (ref 0.00–1.49)

## 2012-10-11 MED ORDER — MIDAZOLAM HCL 2 MG/2ML IJ SOLN
INTRAMUSCULAR | Status: AC | PRN
  Administered 2012-10-11 (×3): 1 mg via INTRAVENOUS

## 2012-10-11 MED ORDER — IOHEXOL 300 MG/ML  SOLN
50.0000 mL | Freq: Once | INTRAMUSCULAR | Status: AC | PRN
  Administered 2012-10-11: 20 mL via INTRAVENOUS

## 2012-10-11 MED ORDER — FENTANYL CITRATE 0.05 MG/ML IJ SOLN
INTRAMUSCULAR | Status: AC | PRN
  Administered 2012-10-11: 50 ug via INTRAVENOUS
  Administered 2012-10-11: 25 ug via INTRAVENOUS
  Administered 2012-10-11: 50 ug via INTRAVENOUS

## 2012-10-11 NOTE — Procedures (Signed)
Procedure:  Gastrostomy Findings:  20 Fr g-tube placed with tip in body of stomach.

## 2012-10-11 NOTE — H&P (Signed)
Agree 

## 2012-10-12 NOTE — Progress Notes (Signed)
PULMONARY/CCM NOTE  Requesting MD/Service: Hijazi Date of admission: 09/26/12 Date of consult: 10/03/12 Reason for consultation: acute on chronic respiratory failure  Brief profile: 89F with severe COPD intubated 2/26 for severe COPD with bronchospasm, new RLL HCAP with copious purulent sputum, severe hypercarbia and marked leukocytosis.   Lines/Tubes: ETT 2/26 >> 3/3, Trach 3/3 (feinstein)>>> PEG tube 10/11/2012 >>  Microbiology: Respiratory 2/26 >>   moderate staph aureus [sensitivities pending] Blood 2/26 >>  Urine 2/26 >> negative  Med list  - This was reviewed.7 2014. Significant medications IV vancomycin and by mouth prednisone.   Antibiotics:  Vancomycin 10/08/2012 >> (stop date is 10/18/2012) SUBJECTIVE/OVERNIGHT/INTERVAL HX  10/12/2012: She is doing 16 hours on pressure support trial. She had a PEG tube placed yesterday. She is overall progressing quite well    Vitals reviewed in bedside chart. Temperature 98.7. Pulse of 92. Respiratory rate of 16. Blood pressure 122/82. Pulse ox of 98%. Blood sugar of 86 mg percent.     EXAM:  Gen: No distress frail but looking increasingly more conditioned HEENT: ETT in place Lungs: Prolonged exhalation, decreased breath sounds, synchronous with the ventilator Cardiovascular: s1s2 regular, tachycardic Abdomen: soft, non tender EXT: no edema Neuro: follows commands CAM-ICU negative for delirium and RASS sedation score 0   BMET Lab Results  Component Value Date   CREATININE 0.26* 10/11/2012   BUN 7 10/11/2012   NA 140 10/11/2012   K 4.3 10/11/2012   CL 100 10/11/2012   CO2 32 10/11/2012     CBC Lab Results  Component Value Date   WBC 8.8 10/11/2012   HGB 8.0* 10/11/2012   HCT 25.0* 10/11/2012   MCV 95.1 10/11/2012   PLT 489* 10/11/2012   ABG    Component Value Date/Time   PHART 7.375 10/04/2012 0940   PCO2ART 43.1 10/04/2012 0940   PO2ART 87.8 10/04/2012 0940   HCO3 24.6* 10/04/2012 0940   TCO2 26.0 10/04/2012 0940    ACIDBASEDEF 1.6 10/04/2012 0429   O2SAT 99.0 10/04/2012 0940   Imaging:   Ir Gastrostomy Tube Mod Sed  10/11/2012  *RADIOLOGY REPORT*  Clinical Data:  Respiratory failure, chronic dysphagia and malnutrition.  The patient requires a gastrostomy tube.  PERCUTANEOUS GASTROSTOMY TUBE PLACEMENT  Sedation:  3.0 mg IV Versed; 125 mcg IV Fentanyl.  Total Moderate Sedation Time: 20 minutes.  Contrast:  20 ml Omnipaque-300  Additional Medications:  The patient is on scheduled IV antibiotics every 8 hours and did not receive additional antibiotics for the procedure.  Fluoroscopy Time: 2.1 minutes.  Procedure:  The procedure, risks, benefits, and alternatives were explained to the patient.  Questions regarding the procedure were encouraged and answered.  The patient understands and consents to the procedure.  The evening prior to the procedure, the patient was given thin liquid barium to ingest in order to opacify the colon.  A 5-French catheter was then advanced through the  patient's mouth under fluoroscopy into the esophagus and to the level of the stomach. This catheter was used to insufflate the stomach with air under fluoroscopy.  The abdominal wall was prepped with Betadine in a sterile fashion, and a sterile drape was applied covering the operative field.  A sterile gown and sterile gloves were used for the procedure. Local anesthesia was provided with 1% Lidocaine.  A skin incision was made in the upper abdominal wall.  Under fluoroscopy, an 18 gauge trocar needle was advanced into the stomach.  Contrast injection was performed to confirm intraluminal position of the  needle tip.  A single T tack was then deployed in the lumen of the stomach.  This was brought up to tension at the skin surface.  Over a guidewire, a 9-French sheath was advanced into the lumen of the stomach.  The wire was left in place as a safety wire.  A loop snare device from a percutaneous gastrostomy kit was then advanced into the stomach.  A  floppy guide wire was advanced through the orogastric catheter under fluoroscopy in the stomach.  The loop snare advanced through the percutaneous gastric access was used to snare the guide wire. This allowed withdrawal of the loop snare out of the patient's mouth by retraction of the orogastric catheter and wire.  A 20-French bumper retention gastrostomy tube was looped around the snare device.  It was then pulled back through the patient's mouth. The retention bumper was brought up to the anterior gastric wall. The T tack suture was cut at the skin.  The exiting gastrostomy tube was cut to appropriate length and a feeding adapter applied. The catheter was injected with contrast material to confirm position and a fluoroscopic spot image saved.  The tube was then flushed with saline.  A dressing was applied over the gastrostomy exit site.  Complications:  None  Findings:  Initial fluoroscopy demonstrates adequate opacification of the colon by ingested barium in order to prevent colonic injury during the procedure.  The stomach distended well with air allowing safe placement of the gastrostomy tube.  After placement, the tip of the gastrostomy tube lies in the body of the stomach.  IMPRESSION: Percutaneous gastrostomy with placement of a 20-French bumper retention tube in the body of the stomach.  This tube can be used for percutaneous feeds beginning in 24 hours after placement.   Original Report Authenticated By: Irish Lack, M.D.    Dg Abd Portable 1v  10/11/2012  *RADIOLOGY REPORT*  Clinical Data: Nasogastric tube placement.  PORTABLE ABDOMEN - 1 VIEW  Comparison: 10/03/2012  Findings: Two nasogastric tubes are now seen with tips overlying the gastric fundus and proximal gastric body in the left upper quadrant.  Contrast is seen throughout distal small bowel and nearly the entire colon.  There is no evidence of dilated bowel loops.  IMPRESSION:  1.  Two nasogastric are seen with tips in the proximal stomach.  2.  Residual contrast throughout distal small bowel and colon.   Original Report Authenticated By: Myles Rosenthal, M.D.     PLAN:  Acute on chronic respiratory failure from HCAP in setting of severe COPD.  Has multiple prior intubations.  s/p trach 3/3  10/12/2012: Doing 16 hours on pressure support trial. 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 Kalman Shan, M.D., [facility].C.P Pulmonary and Critical Care Medicine Staff Physician  System [place] [facility] and Critical [facility] Pager: [phone], If no answer or between  15:00h - 7:00h: call [phone]  10/12/2012 12:56 PM

## 2012-10-14 ENCOUNTER — Other Ambulatory Visit (HOSPITAL_COMMUNITY): Payer: Self-pay

## 2012-10-15 ENCOUNTER — Other Ambulatory Visit (HOSPITAL_COMMUNITY): Payer: Self-pay

## 2012-10-15 LAB — CBC
Hemoglobin: 12.2 g/dL (ref 12.0–15.0)
MCH: 29.5 pg (ref 26.0–34.0)
MCHC: 30.5 g/dL (ref 30.0–36.0)
MCV: 96.9 fL (ref 78.0–100.0)
RBC: 4.13 MIL/uL (ref 3.87–5.11)

## 2012-10-15 LAB — BASIC METABOLIC PANEL
BUN: 13 mg/dL (ref 6–23)
CO2: 35 mEq/L — ABNORMAL HIGH (ref 19–32)
Calcium: 9.1 mg/dL (ref 8.4–10.5)
Creatinine, Ser: 0.35 mg/dL — ABNORMAL LOW (ref 0.50–1.10)
GFR calc non Af Amer: >90 mL/min (ref >90)
Glucose, Bld: 79 mg/dL (ref 70–99)

## 2012-10-15 NOTE — Progress Notes (Signed)
PULMONARY/CCM NOTE  Requesting MD/Service: Hijazi Date of admission: 09/26/12 Date of consult: 10/03/12 Reason for consultation: acute on chronic respiratory failure  Brief profile: 84F with severe COPD intubated 2/26 for severe COPD with bronchospasm, new RLL HCAP with copious purulent sputum, severe hypercarbia and marked leukocytosis.   Lines/Tubes: ETT 2/26 >> 3/3, Trach 3/3 (feinstein)>>> PEG tube 10/11/2012 >>  Microbiology: Respiratory 2/26 >>   moderate staph aureus [sensitivities pending] Blood 2/26 >> ng Urine 2/26 >> negative    Antibiotics:  Vancomycin 10/08/2012 >> (stop date is 10/18/2012) SUBJECTIVE/OVERNIGHT/INTERVAL HX  Tolerating ATC. She had a PEG tube placed . She is overall progressing quite well    Vitals reviewed in bedside chart. Stable, afebrile     EXAM:  Gen: No distress frail but looking increasingly more conditioned HEENT: ETT in place Lungs: Prolonged exhalation, decreased breath sounds, synchronous with the ventilator Cardiovascular: s1s2 regular, tachycardic Abdomen: soft, non tender EXT: no edema Neuro: follows commands CAM-ICU negative for delirium and RASS sedation score 0   BMET Lab Results  Component Value Date   CREATININE 0.35* 10/15/2012   BUN 13 10/15/2012   NA 139 10/15/2012   K 4.2 10/15/2012   CL 96 10/15/2012   CO2 35* 10/15/2012     CBC Lab Results  Component Value Date   WBC 10.2 10/15/2012   HGB 12.2 10/15/2012   HCT 40.0 10/15/2012   MCV 96.9 10/15/2012   PLT 572* 10/15/2012   ABG    Component Value Date/Time   PHART 7.375 10/04/2012 0940   PCO2ART 43.1 10/04/2012 0940   PO2ART 87.8 10/04/2012 0940   HCO3 24.6* 10/04/2012 0940   TCO2 26.0 10/04/2012 0940   ACIDBASEDEF 1.6 10/04/2012 0429   O2SAT 99.0 10/04/2012 0940   Imaging:   Dg Chest Port 1 View  10/15/2012  *RADIOLOGY REPORT*  Clinical Data: Respiratory failure.  PORTABLE CHEST - 1 VIEW  Comparison: Chest x-ray 10/08/2012.  Findings: A tracheostomy tube  is in place with tip 6.4 cm above the carina. There is a left-sided internal jugular central venous catheter with tip terminating in the in the distal superior vena cava. Previously noted nasogastric tube has been removed. Improvement in bibasilar airspace disease, particularly on the right.  No definite pleural effusions.  Pulmonary vasculature is within normal limits.  Heart size is upper limits of normal. The patient is rotated to the left on today's exam, resulting in distortion of the mediastinal contours and reduced diagnostic sensitivity and specificity for mediastinal pathology. Atherosclerosis in the thoracic aorta.  IMPRESSION: 1.  Support apparatus, as above. 2.  Continued improvement in bibasilar airspace opacities suggesting resolving infection or sequelae of aspiration. 3.  Atherosclerosis.   Original Report Authenticated By: Trudie Reed, M.D.    Dg Abd Portable 1v  10/14/2012  *RADIOLOGY REPORT*  Clinical Data: Rule out ileus.  PORTABLE ABDOMEN - 1 VIEW  Comparison: 10/11/2012  Findings: Single supine view of the abdomen and pelvis.  A gastrostomy tube projects over the gastric body. No free intraperitoneal air.  Normal caliber of the colon, which is partially contrast-filled.  Small bowel loops measure up to 2.8 cm, upper normal.  No focal transition point identified.  Removal of nasogastric tubes since 10/11/2012.  Right proximal femoral fixation.  IMPRESSION: Small bowel loops measuring upper normal, possibly representing mild residual adynamic ileus.  No bowel obstruction or convincing evidence of free intraperitoneal air.   Original Report Authenticated By: Jeronimo Greaves, M.D.     PLAN:  Acute on  chronic respiratory failure from HCAP in setting of severe COPD.  Has multiple prior intubations.  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 Tonny Bollman. [place] [facility] Critical [facility] Pager [phone] If no response call [phone]   10/15/2012 12:25 PM

## 2012-10-16 ENCOUNTER — Other Ambulatory Visit (HOSPITAL_COMMUNITY): Payer: Self-pay

## 2012-10-17 NOTE — Progress Notes (Signed)
PULMONARY/CCM NOTE  Requesting MD/Service: Hijazi Date of admission: 09/26/12 Date of consult: 10/03/12 Reason for consultation: acute on chronic respiratory failure  Brief profile: 62F with severe COPD intubated 2/26 for severe COPD with bronchospasm, new RLL HCAP with copious purulent sputum, severe hypercarbia and marked leukocytosis.   Lines/Tubes: ETT 2/26 >> 3/3, Trach 3/3 (feinstein)>>> PEG tube 10/11/2012 >>  Microbiology: Respiratory 2/26 >>   moderate staph aureus [sensitivities pending] Blood 2/26 >> ng Urine 2/26 >> negative    Antibiotics:  Vancomycin 10/08/2012 >> (stop date is 10/18/2012)  SUBJECTIVE/OVERNIGHT/INTERVAL HX  Tolerating ATC.  oob to chair    Vitals reviewed in bedside chart. Stable, afebrile     EXAM:  Gen: No distress frail but looking increasingly more conditioned HEENT: trach Lungs: Prolonged exhalation, decreased breath sounds, synchronous with the ventilator Cardiovascular: s1s2 regular, tachycardic Abdomen: soft, non tender EXT: no edema Neuro: follows commands , alert, non focal   BMET Lab Results  Component Value Date   CREATININE 0.35* 10/15/2012   BUN 13 10/15/2012   NA 139 10/15/2012   K 4.2 10/15/2012   CL 96 10/15/2012   CO2 35* 10/15/2012     CBC Lab Results  Component Value Date   WBC 10.2 10/15/2012   HGB 12.2 10/15/2012   HCT 40.0 10/15/2012   MCV 96.9 10/15/2012   PLT 572* 10/15/2012   ABG    Component Value Date/Time   PHART 7.375 10/04/2012 0940   PCO2ART 43.1 10/04/2012 0940   PO2ART 87.8 10/04/2012 0940   HCO3 24.6* 10/04/2012 0940   TCO2 26.0 10/04/2012 0940   ACIDBASEDEF 1.6 10/04/2012 0429   O2SAT 99.0 10/04/2012 0940   Imaging:   Dg Abd Portable 1v  10/16/2012  *RADIOLOGY REPORT*  Clinical Data: Abdominal pain.  Evaluate for bowel obstruction.  PORTABLE ABDOMEN - 1 VIEW  Comparison: 10/14/2012  Findings: There is decreased oral contrast within the colon.  Again noted is a gastrostomy tube.  There is  gas throughout the colon. There is a nonobstructive bowel gas pattern.  Again noted is surgical fixation of the right hip.  IMPRESSION: Nonspecific bowel gas pattern.  Decreased oral contrast in the colon.   Original Report Authenticated By: Richarda Overlie, M.D.     PLAN:  Acute on chronic respiratory failure from HCAP in setting of severe COPD.  Has multiple prior intubations.  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 [facility]. [place] [facility] Critical [facility] Pager [phone] If no response call [phone]   10/17/2012 11:37 AM

## 2012-10-18 LAB — BASIC METABOLIC PANEL
BUN: 11 mg/dL (ref 6–23)
CO2: 37 mEq/L — ABNORMAL HIGH (ref 19–32)
Chloride: 95 mEq/L — ABNORMAL LOW (ref 96–112)
Creatinine, Ser: 0.37 mg/dL — ABNORMAL LOW (ref 0.50–1.10)
Glucose, Bld: 122 mg/dL — ABNORMAL HIGH (ref 70–99)
Potassium: 3.6 mEq/L (ref 3.5–5.1)

## 2012-10-18 LAB — CBC
HCT: 31.6 % — ABNORMAL LOW (ref 36.0–46.0)
Hemoglobin: 9.9 g/dL — ABNORMAL LOW (ref 12.0–15.0)
MCV: 96.6 fL (ref 78.0–100.0)
RBC: 3.27 MIL/uL — ABNORMAL LOW (ref 3.87–5.11)
RDW: 14.4 % (ref 11.5–15.5)
WBC: 8.7 10*3/uL (ref 4.0–10.5)

## 2012-10-19 LAB — RENAL FUNCTION PANEL
Albumin: 3.1 g/dL — ABNORMAL LOW (ref 3.5–5.2)
BUN: 13 mg/dL (ref 6–23)
Creatinine, Ser: 0.36 mg/dL — ABNORMAL LOW (ref 0.50–1.10)
Glucose, Bld: 117 mg/dL — ABNORMAL HIGH (ref 70–99)
Phosphorus: 4.9 mg/dL — ABNORMAL HIGH (ref 2.3–4.6)
Potassium: 4 mEq/L (ref 3.5–5.1)

## 2012-10-19 LAB — CBC
HCT: 31.9 % — ABNORMAL LOW (ref 36.0–46.0)
Hemoglobin: 10 g/dL — ABNORMAL LOW (ref 12.0–15.0)
MCHC: 31.3 g/dL (ref 30.0–36.0)
MCV: 95.5 fL (ref 78.0–100.0)
RDW: 14.2 % (ref 11.5–15.5)

## 2012-10-19 NOTE — Progress Notes (Signed)
PULMONARY/CCM NOTE  Requesting MD/Service: Hijazi Date of admission: 09/26/12 Date of consult: 10/03/12 Reason for consultation: acute on chronic respiratory failure  Brief profile: 66F with severe COPD intubated 2/26 for severe COPD with bronchospasm, new RLL HCAP with copious purulent sputum, severe hypercarbia and marked leukocytosis.   Lines/Tubes: ETT 2/26 >> 3/3, Trach 3/3 (feinstein)>>> PEG tube 10/11/2012 >>  Microbiology: Respiratory 2/26 >>   moderate staph aureus [sensitivities pending] Blood 2/26 >> ng Urine 2/26 >> negative    Antibiotics:  Vancomycin 10/08/2012 >> 03/13  SUBJECTIVE/OVERNIGHT/INTERVAL HX  Tolerating ATC.  oob to chair    Vitals reviewed in bedside chart. Stable, afebrile     EXAM:  Gen: No distress frail but looking increasingly more conditioned HEENT: trach Lungs: Prolonged exhalation, decreased breath sounds Cardiovascular: s1s2 regular, tachycardic Abdomen: soft, non tender EXT: no edema Neuro: follows commands , alert, non focal   BMET Lab Results  Component Value Date   CREATININE 0.37* 10/18/2012   BUN 11 10/18/2012   NA 139 10/18/2012   K 3.6 10/18/2012   CL 95* 10/18/2012   CO2 37* 10/18/2012     CBC Lab Results  Component Value Date   WBC 8.7 10/18/2012   HGB 9.9* 10/18/2012   HCT 31.6* 10/18/2012   MCV 96.6 10/18/2012   PLT 701* 10/18/2012   ABG    Component Value Date/Time   PHART 7.375 10/04/2012 0940   PCO2ART 43.1 10/04/2012 0940   PO2ART 87.8 10/04/2012 0940   HCO3 24.6* 10/04/2012 0940   TCO2 26.0 10/04/2012 0940   ACIDBASEDEF 1.6 10/04/2012 0429   O2SAT 99.0 10/04/2012 0940   Imaging:   No results found.  PLAN:  Acute on chronic respiratory failure from HCAP in setting of severe COPD.  Has multiple prior intubations.  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 - Off vanc   Cyril Mourning MD. FCCP. [place] [facility] Pager [phone] If no response call [phone]   10/19/2012 9:47 AM

## 2012-10-20 LAB — CBC
HCT: 38.9 % (ref 36.0–46.0)
MCH: 30.1 pg (ref 26.0–34.0)
MCV: 93.7 fL (ref 78.0–100.0)
Platelets: 504 10*3/uL — ABNORMAL HIGH (ref 150–400)
RBC: 4.15 MIL/uL (ref 3.87–5.11)
RDW: 14.2 % (ref 11.5–15.5)
WBC: 9 10*3/uL (ref 4.0–10.5)

## 2012-10-20 LAB — RENAL FUNCTION PANEL
BUN: 13 mg/dL (ref 6–23)
CO2: 36 mEq/L — ABNORMAL HIGH (ref 19–32)
Calcium: 9.9 mg/dL (ref 8.4–10.5)
Chloride: 94 mEq/L — ABNORMAL LOW (ref 96–112)
Creatinine, Ser: 0.37 mg/dL — ABNORMAL LOW (ref 0.50–1.10)

## 2012-10-21 ENCOUNTER — Other Ambulatory Visit (HOSPITAL_COMMUNITY): Payer: Self-pay

## 2012-10-22 ENCOUNTER — Other Ambulatory Visit (HOSPITAL_COMMUNITY): Payer: Self-pay

## 2012-10-22 DIAGNOSIS — Z930 Tracheostomy status: Secondary | ICD-10-CM

## 2012-10-22 NOTE — Progress Notes (Signed)
PULMONARY/CCM NOTE  Requesting MD/Service: Hijazi Date of admission: 09/26/12 Date of consult: 10/03/12 Reason for consultation: acute on chronic respiratory failure  Brief profile: 7 F with severe COPD intubated 2/26 for severe COPD with bronchospasm, new RLL HCAP with copious purulent sputum, severe hypercarbia and marked leukocytosis. Required trach 3/3.    Lines/Tubes: ETT 2/26 >> 3/3, Trach 3/3 (feinstein)>>> PEG tube 10/11/2012 >>  Microbiology: Respiratory 2/26 >>   moderate staph aureus [sensitivities pending] Blood 2/26 >> ng Urine 2/26 >> negative    Antibiotics:  Vancomycin 3/03>> 3/13  SUBJECTIVE:  Pt reports she has required increased O2 over last 2 days, not tolerating PMV well due to drop in sats.  SLP eval on hold due to changes.  "I am so ready to eat".  Minimal sputum production.    Vitals reviewed in bedside chart. Stable, afebrile  EXAM:  Gen: No distress frail but looking increasingly more conditioned HEENT: trach Lungs: Prolonged exhalation, decreased breath sounds.  #4 trach midline c/d/i Cardiovascular: s1s2 regular, tachycardic Abdomen: soft, non tender EXT: no edema Neuro: follows commands , alert, non focal   BMET Lab Results  Component Value Date   CREATININE 0.37* 10/20/2012   BUN 13 10/20/2012   NA 139 10/20/2012   K 4.1 10/20/2012   CL 94* 10/20/2012   CO2 36* 10/20/2012   CBC Lab Results  Component Value Date   WBC 9.0 10/20/2012   HGB 12.5 10/20/2012   HCT 38.9 10/20/2012   MCV 93.7 10/20/2012   PLT 504* 10/20/2012   ABG    Component Value Date/Time   PHART 7.375 10/04/2012 0940   PCO2ART 43.1 10/04/2012 0940   PO2ART 87.8 10/04/2012 0940   HCO3 24.6* 10/04/2012 0940   TCO2 26.0 10/04/2012 0940   ACIDBASEDEF 1.6 10/04/2012 0429   O2SAT 99.0 10/04/2012 0940   Imaging:   Dg Chest Port 1 View  10/21/2012  *RADIOLOGY REPORT*  Clinical Data: Resolving pneumonia  PORTABLE CHEST - 1 VIEW  Comparison: 10/15/2012  Findings: Tracheostomy  tube and left internal jugular vein central venous catheter are stable.  Stable patchy airspace disease at the left base.  Minimal patchy density in the right base unchanged. Upper lungs clear.  No pneumothorax  IMPRESSION: Stable bibasilar airspace disease left greater than right.   Original Report Authenticated By: Jolaine Click, M.D.     PLAN:  Acute on chronic respiratory failure - in setting of HCAP with underlying severe COPD.  Has multiple prior intubations. s/p trach 3/3 per DF.  Currently on prednisone 10 mg per day.  3/16 cxr reviewed, minimal atx bilaterally, changes c/w COPD.    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 - Off vanc   Canary Brim, NP-C [facility] Pgr: [phone] or [phone]   2:01 PM   I have interviewed and examined the patient and reviewed the database. I have formulated the assessment and plan as reflected in the note above with amendments made by me.   Billy Fischer, MD;  PCCM service; Mobile [phone]  Billy Fischer, MD ; [facility] service Mobile [phone].  After 5:30 PM or weekends, call [phone]

## 2012-10-23 LAB — BASIC METABOLIC PANEL
BUN: 20 mg/dL (ref 6–23)
CO2: 39 mEq/L — ABNORMAL HIGH (ref 19–32)
Glucose, Bld: 87 mg/dL (ref 70–99)
Potassium: 3.5 mEq/L (ref 3.5–5.1)
Sodium: 141 mEq/L (ref 135–145)

## 2012-10-25 ENCOUNTER — Other Ambulatory Visit (HOSPITAL_COMMUNITY): Payer: Self-pay

## 2012-10-27 LAB — CBC WITH DIFFERENTIAL/PLATELET
Eosinophils Absolute: 0.3 10*3/uL (ref 0.0–0.7)
Eosinophils Relative: 4 % (ref 0–5)
HCT: 32.3 % — ABNORMAL LOW (ref 36.0–46.0)
Hemoglobin: 10 g/dL — ABNORMAL LOW (ref 12.0–15.0)
Lymphs Abs: 1.5 10*3/uL (ref 0.7–4.0)
MCH: 29.9 pg (ref 26.0–34.0)
MCHC: 31 g/dL (ref 30.0–36.0)
MCV: 96.4 fL (ref 78.0–100.0)
Monocytes Absolute: 1.1 10*3/uL — ABNORMAL HIGH (ref 0.1–1.0)
Monocytes Relative: 12 % (ref 3–12)
RBC: 3.35 MIL/uL — ABNORMAL LOW (ref 3.87–5.11)

## 2012-10-28 LAB — BASIC METABOLIC PANEL
BUN: 19 mg/dL (ref 6–23)
Calcium: 9.3 mg/dL (ref 8.4–10.5)
Creatinine, Ser: 0.42 mg/dL — ABNORMAL LOW (ref 0.50–1.10)
GFR calc non Af Amer: >90 mL/min (ref >90)
Glucose, Bld: 86 mg/dL (ref 70–99)
Potassium: 4.9 mEq/L (ref 3.5–5.1)

## 2012-10-29 NOTE — Progress Notes (Signed)
PULMONARY/CCM NOTE  Requesting MD/Service: Hijazi Date of admission: 09/26/12 Date of consult: 10/03/12 Reason for consultation: acute on chronic respiratory failure  Brief profile: 53 F with severe COPD intubated 2/26 for severe COPD with bronchospasm, new RLL HCAP with copious purulent sputum, severe hypercarbia and marked leukocytosis. Required trach 3/3.    Lines/Tubes: ETT 2/26 >> 3/3,  Trach 3/3 (feinstein)>>> PEG tube 10/11/2012 >>  Microbiology: Respiratory 2/26 >>   moderate staph aureus [sensitivities pending]  Antibiotics:  Vancomycin 3/03>> 3/13  SUBJECTIVE:   Denies dyspnea, chest pain.  Vitals reviewed in bedside chart.  EXAM:  Gen: No distress HEENT: trach clean Lungs: Prolonged exhalation, decreased breath sounds.  #4 trach Cardiovascular: s1s2 regular Abdomen: soft, non tender EXT: no edema Neuro: follows commands , alert, non focal   BMET Lab Results  Component Value Date   CREATININE 0.42* 10/28/2012   BUN 19 10/28/2012   NA 141 10/28/2012   K 4.9 10/28/2012   CL 98 10/28/2012   CO2 39* 10/28/2012   CBC Lab Results  Component Value Date   WBC 9.2 10/27/2012   HGB 10.0* 10/27/2012   HCT 32.3* 10/27/2012   MCV 96.4 10/27/2012   PLT 342 10/27/2012   ABG    Component Value Date/Time   PHART 7.375 10/04/2012 0940   PCO2ART 43.1 10/04/2012 0940   PO2ART 87.8 10/04/2012 0940   HCO3 24.6* 10/04/2012 0940   TCO2 26.0 10/04/2012 0940   ACIDBASEDEF 1.6 10/04/2012 0429   O2SAT 99.0 10/04/2012 0940    Impression:  Acute on chronic respiratory failure 2nd to HCAP with recurrent VDRF and hx of severe COPD/emphysema. P: Continue spiriva, performist, budesonide Wean prednisone off as tolerated >> may require low dose prednisone No plans for decannulation given hx of recurrent intubations Oxygen to keep SpO2 > 90% Continue speech valve F/u with pulmonologist in [place] Minor ACNP [facility] PCCM Pager [phone] till 3 pm If no answer page  [phone] 10/29/2012, 10:07 AM  Reviewed above, examined pt, and agree with assessment/plan.  She is doing very well, and on stable pulmonary regimen.  PCCM will sign off.  Please call if additional help needed.  Coralyn Helling, MD [facility] 10/29/2012, 11:39 AM Pager:  [phone] After 3pm call: [phone]

## 2012-10-30 LAB — BASIC METABOLIC PANEL
BUN: 18 mg/dL (ref 6–23)
Calcium: 9.8 mg/dL (ref 8.4–10.5)
Chloride: 97 mEq/L (ref 96–112)
Creatinine, Ser: 0.52 mg/dL (ref 0.50–1.10)
GFR calc Af Amer: >90 mL/min (ref >90)

## 2012-11-01 ENCOUNTER — Other Ambulatory Visit (HOSPITAL_COMMUNITY): Payer: Self-pay

## 2012-11-01 LAB — CBC WITH DIFFERENTIAL/PLATELET
Basophils Absolute: 0 10*3/uL (ref 0.0–0.1)
Basophils Relative: 0 % (ref 0–1)
Eosinophils Relative: 3 % (ref 0–5)
HCT: 32.7 % — ABNORMAL LOW (ref 36.0–46.0)
MCHC: 30.3 g/dL (ref 30.0–36.0)
MCV: 96.7 fL (ref 78.0–100.0)
Monocytes Absolute: 1.4 10*3/uL — ABNORMAL HIGH (ref 0.1–1.0)
Neutro Abs: 11 10*3/uL — ABNORMAL HIGH (ref 1.7–7.7)
RDW: 13.7 % (ref 11.5–15.5)

## 2012-11-02 LAB — CBC
Hemoglobin: 9.7 g/dL — ABNORMAL LOW (ref 12.0–15.0)
MCH: 30.3 pg (ref 26.0–34.0)
MCV: 96.9 fL (ref 78.0–100.0)
RBC: 3.2 MIL/uL — ABNORMAL LOW (ref 3.87–5.11)

## 2012-11-05 LAB — BASIC METABOLIC PANEL
BUN: 12 mg/dL (ref 6–23)
CO2: 34 mEq/L — ABNORMAL HIGH (ref 19–32)
Calcium: 8.8 mg/dL (ref 8.4–10.5)
Creatinine, Ser: 0.44 mg/dL — ABNORMAL LOW (ref 0.50–1.10)
Glucose, Bld: 122 mg/dL — ABNORMAL HIGH (ref 70–99)

## 2012-11-05 LAB — CBC
MCH: 29.4 pg (ref 26.0–34.0)
MCV: 91.9 fL (ref 78.0–100.0)
Platelets: 298 10*3/uL (ref 150–400)
RDW: 13.5 % (ref 11.5–15.5)

## 2012-11-06 LAB — CBC
HCT: 31.6 % — ABNORMAL LOW (ref 36.0–46.0)
MCH: 29.2 pg (ref 26.0–34.0)
MCHC: 31.6 g/dL (ref 30.0–36.0)
MCV: 92.1 fL (ref 78.0–100.0)
Platelets: 309 10*3/uL (ref 150–400)
RDW: 13.5 % (ref 11.5–15.5)

## 2012-11-06 LAB — BASIC METABOLIC PANEL
BUN: 11 mg/dL (ref 6–23)
Calcium: 9.2 mg/dL (ref 8.4–10.5)
Creatinine, Ser: 0.42 mg/dL — ABNORMAL LOW (ref 0.50–1.10)
GFR calc Af Amer: >90 mL/min (ref >90)

## 2012-11-06 LAB — MAGNESIUM: Magnesium: 2 mg/dL (ref 1.5–2.5)

## 2014-04-11 ENCOUNTER — Inpatient Hospital Stay (HOSPITAL_BASED_OUTPATIENT_CLINIC_OR_DEPARTMENT_OTHER)
Admission: RE | Admit: 2014-04-11 | Discharge: 2014-04-11 | Disposition: A | Discharge status: Home / Self Care | Payer: Self-pay | Source: Ambulatory Visit

## 2014-04-12 DIAGNOSIS — R4182 Altered mental status, unspecified: Secondary | ICD-10-CM

## 2014-04-12 NOTE — Procedures (Signed)
History: A 68 year old female with altered mental status   Sedation: None  Technique: This is a 17 channel routine scalp EEG performed at the bedside with bipolar and monopolar montages arranged in accordance to the international 10/20 system of electrode placement. One channel was dedicated to EKG recording.    Background:  The background is attenuated with intermittent bursts of high voltage irregular intermixed alpha and theta activities. These are bilateral and generalized, though with possible slight posterior predominance. This is not a true burst suppression EEG, and given that in between the bursts there is still some centrally predominant low-voltage theta activity but it has the appearance of a burst suppression EEG.  Photic stimulation: Physiologic driving is not performed  EEG Abnormalities: 1) essentially burst suppression pattern  Clinical Interpretation: This EEG is consistent with a severe generalized cerebral dysfunction(encephalopathy). This can be seen with medication effect(e.g. Continuous pharmacological sedation), though can also be seen with other  encephalopathies as well.  There was no seizure or seizure predisposition recorded on this study.   Ritta Slot, MD [facility] [phone]  If 7pm- 7am, please page neurology on call as listed in AMION.

## 2014-05-08 DEATH — deceased

## 2014-09-07 IMAGING — CR DG CHEST 1V PORT
1 series · 1 of 1 positions shown · non-contrast
Comparison: Single view of the chest 09/28/2012.

CLINICAL DATA: Status post intubation.

PORTABLE CHEST - 1 VIEW

[AP]
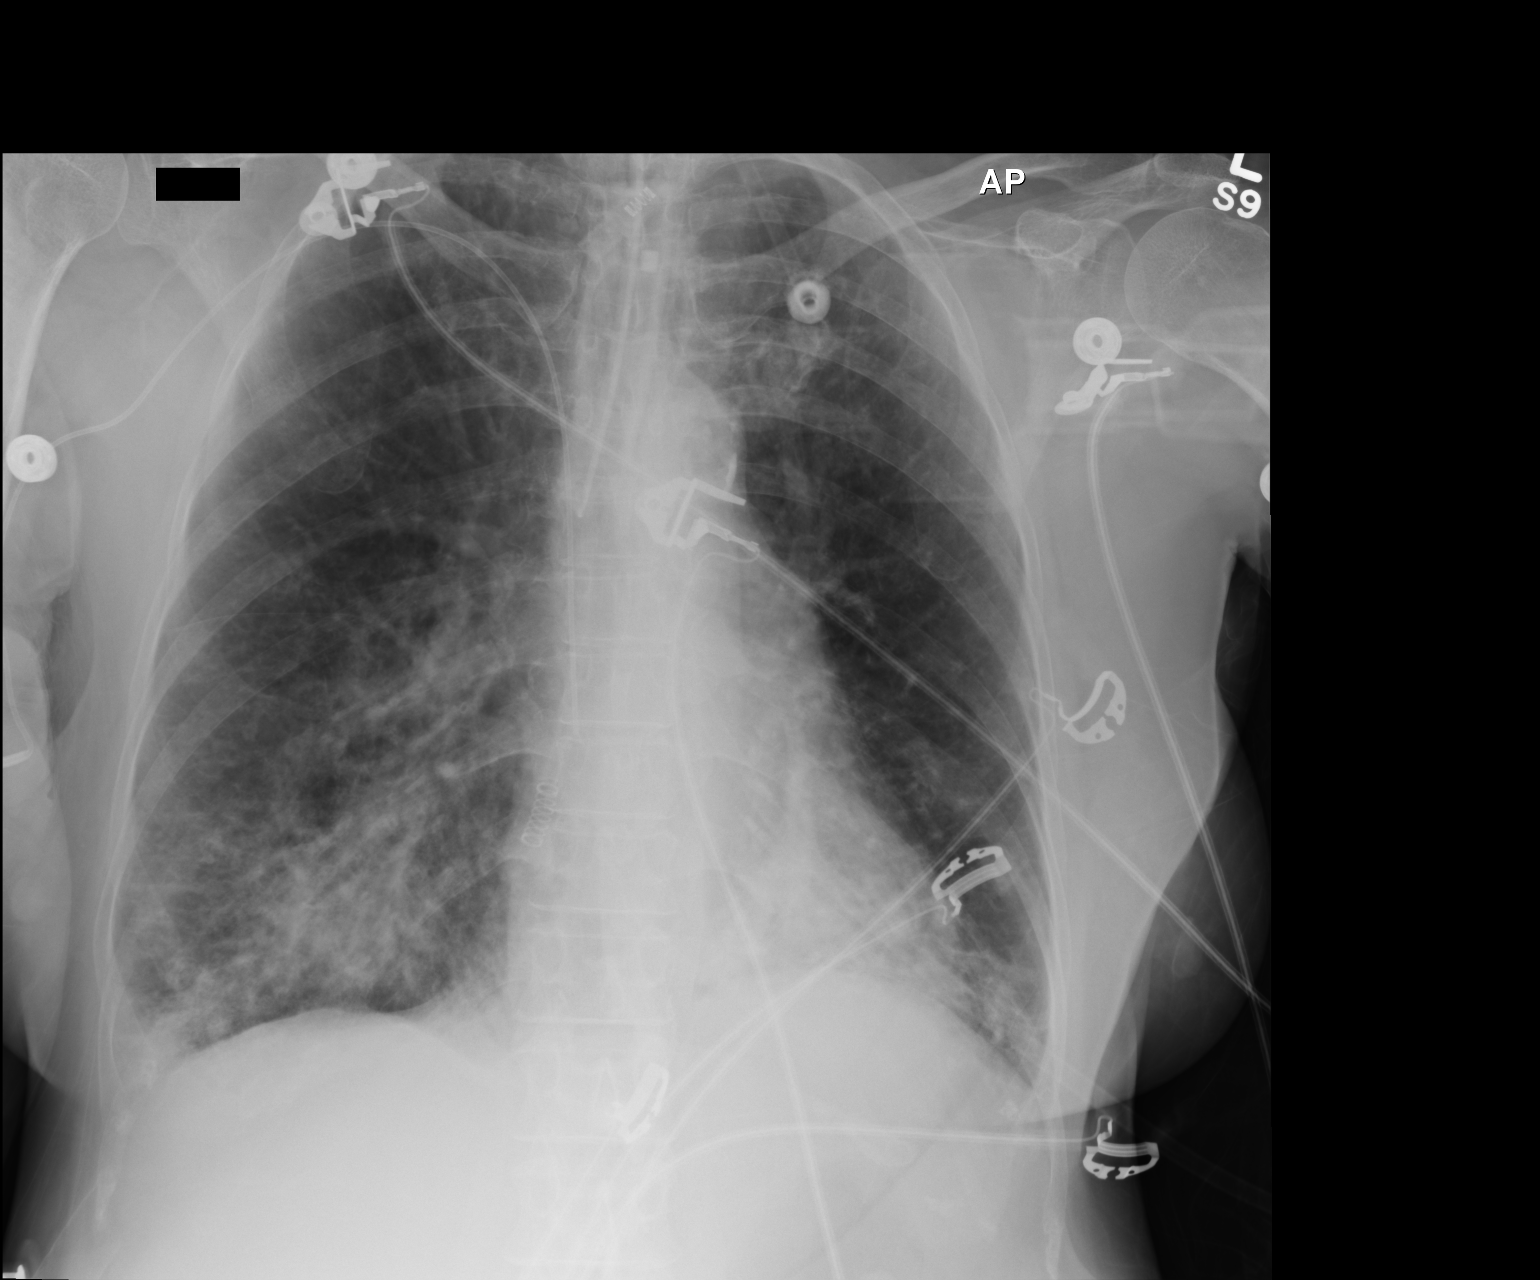

[1 of 1 positions shown; findings below may reference images not displayed]

FINDINGS: Endotracheal tube is in place with the tip at the carina.
Recommend withdrawal of 3 cm.  There is right worse than left
basilar airspace disease.  The lungs appear emphysematous.  Heart
size is normal.  Right PICC is unchanged.
IMPRESSION: 1.  Endotracheal tube tip is near the carina.  The tube should
withdrawn 3 cm.
2.  Bibasilar airspace disease likely due to pneumonia.

## 2014-09-09 IMAGING — CR DG CHEST 1V PORT
1 series · 1 of 1 positions shown · non-contrast
Comparison: 10/05/2012 at [DATE] a.m.

CLINICAL DATA: Central line placement.  Pulmonary infiltrates.

PORTABLE CHEST - 1 VIEW at [DATE] a.m.

[AP]
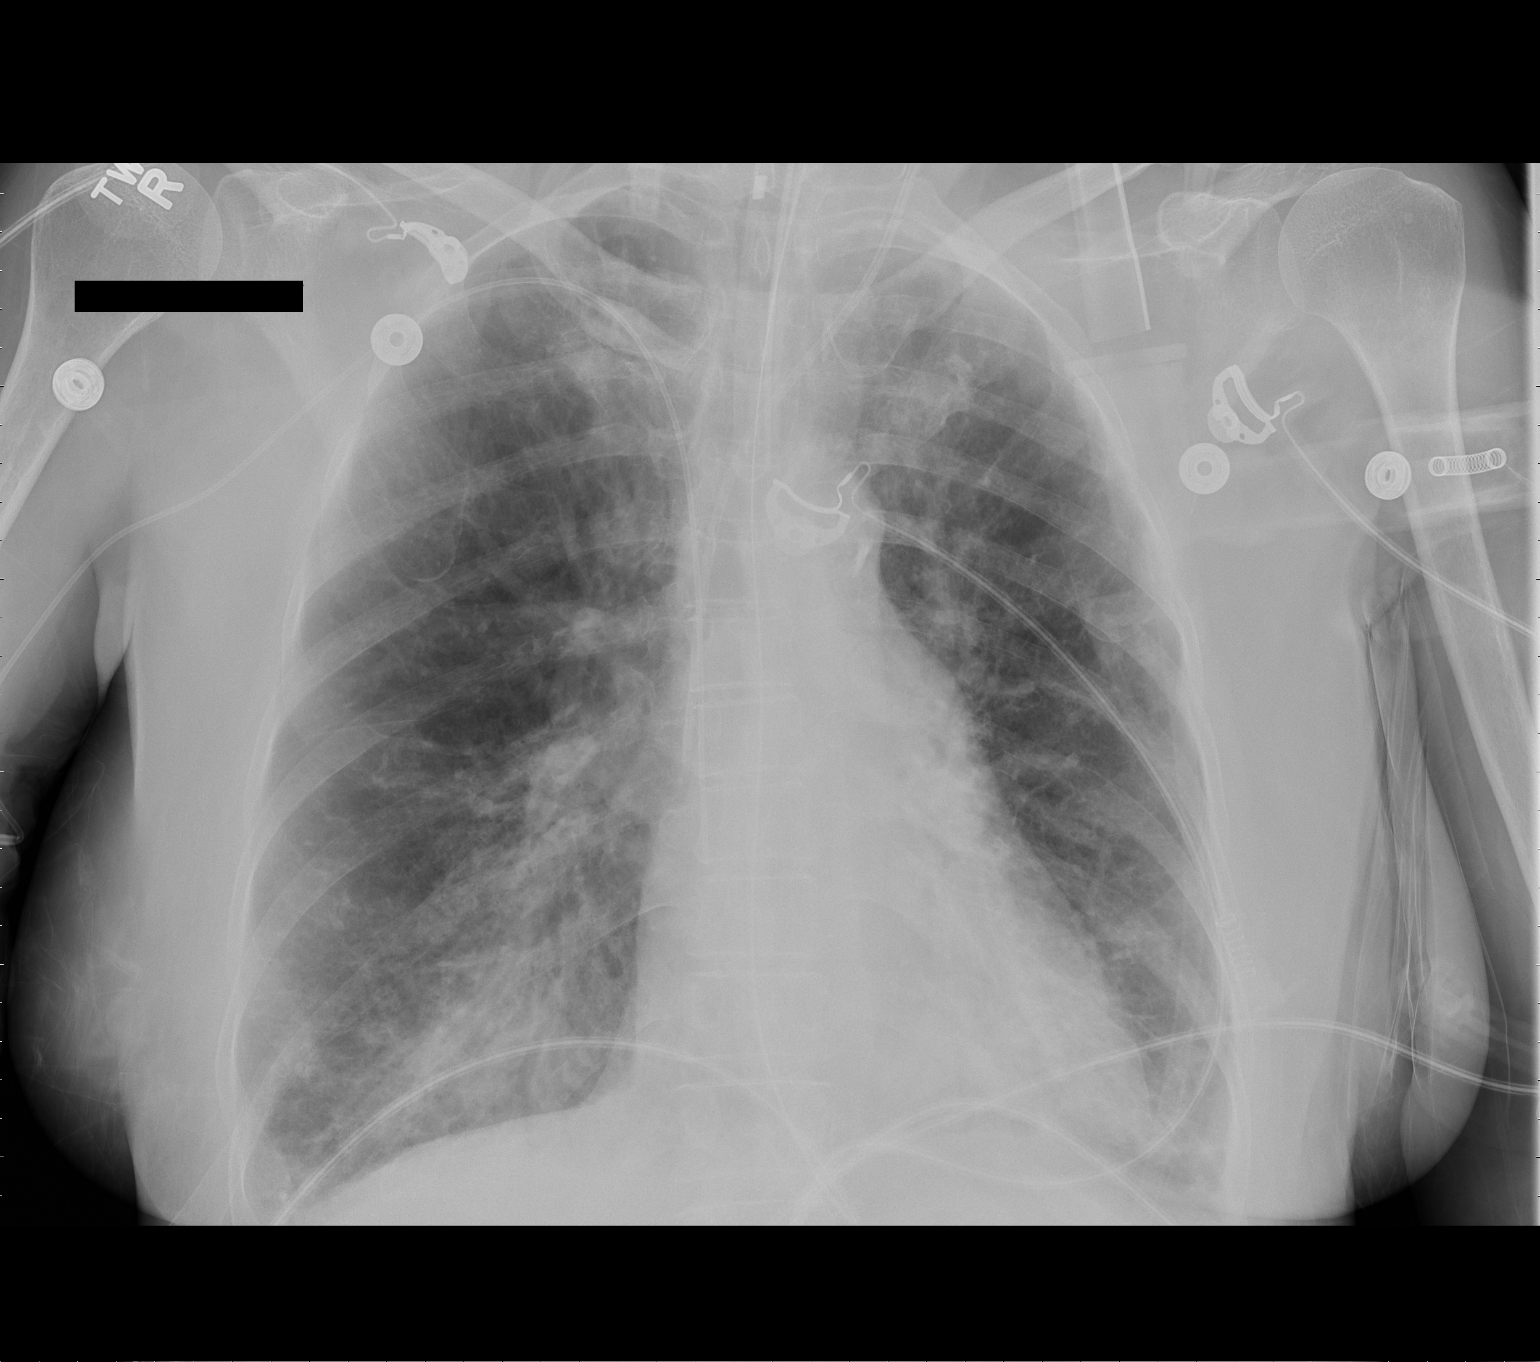

[1 of 1 positions shown; findings below may reference images not displayed]

FINDINGS: Left-sided central venous catheters been inserted with
the tip in good position.  PICC, endotracheal tube, and NG tube
appear in good position.

Persistent bibasilar pulmonary infiltrates, right greater than
left.

Heart size and vascularity are normal.
IMPRESSION: Central catheter in good position.  No pneumothorax.  No change in
the bibasilar pneumonia.

## 2014-09-15 IMAGING — CR DG ABD PORTABLE 1V
1 series · 1 of 1 positions shown · non-contrast
Comparison: 10/03/2012

CLINICAL DATA: Nasogastric tube placement.

PORTABLE ABDOMEN - 1 VIEW

[AP]
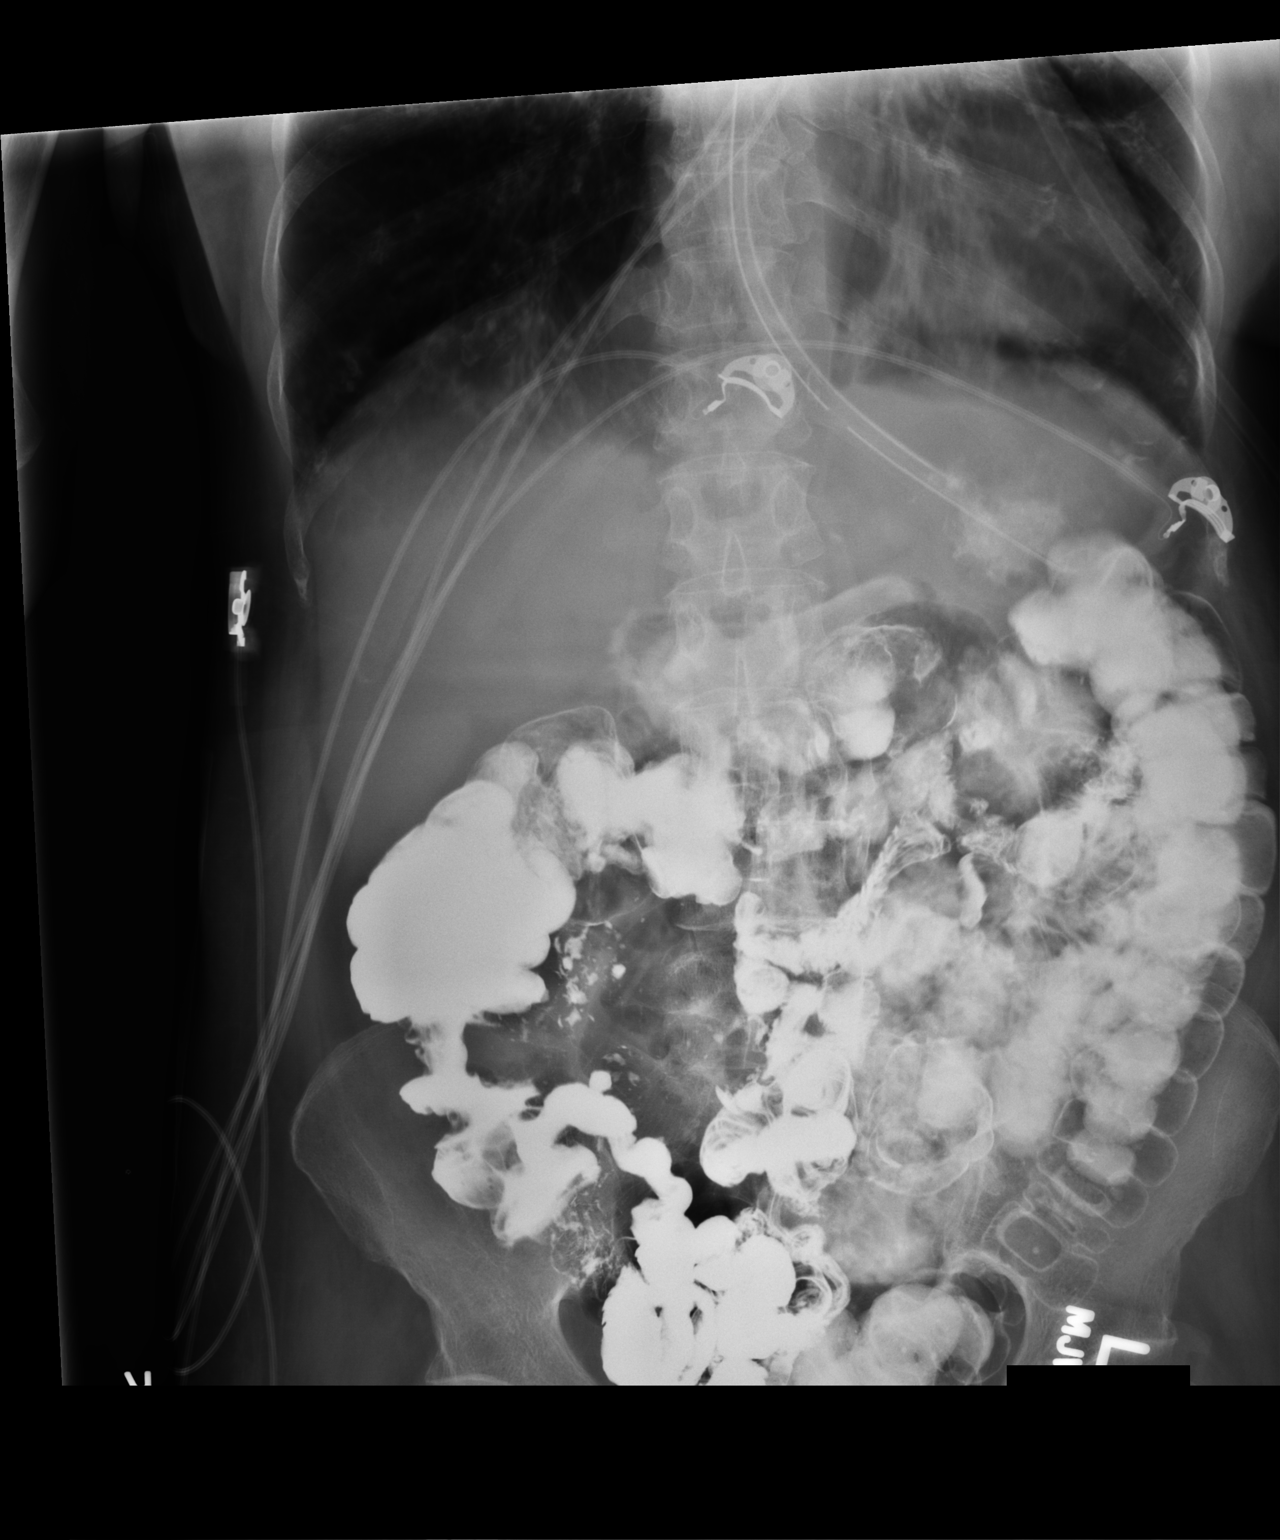

[1 of 1 positions shown; findings below may reference images not displayed]

FINDINGS: Two nasogastric tubes are now seen with tips overlying
the gastric fundus and proximal gastric body in the left upper
quadrant.

Contrast is seen throughout distal small bowel and nearly the
entire colon.  There is no evidence of dilated bowel loops.
IMPRESSION: 1.  Two nasogastric are seen with tips in the proximal stomach.
2.  Residual contrast throughout distal small bowel and colon.

## 2014-09-15 IMAGING — XA IR PERC PLACEMENT GASTROSTOMY
1 series · 2 of 2 positions shown · non-contrast
Comparison: none

CLINICAL DATA: Respiratory failure, chronic dysphagia and
malnutrition.  The patient requires a gastrostomy tube.

[Series 300: gi iod. · 2 of 2 slices shown]
[im 1/2]
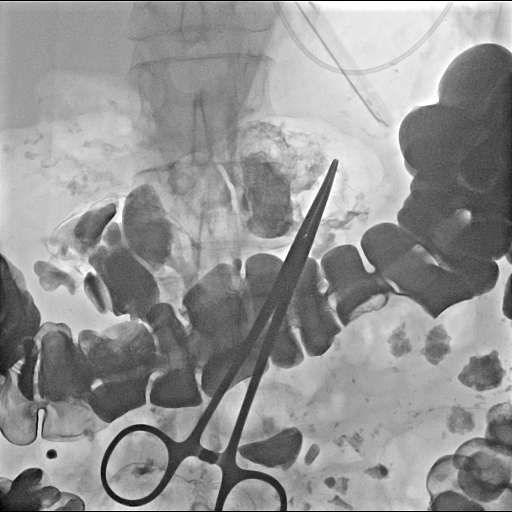
[im 2/2]
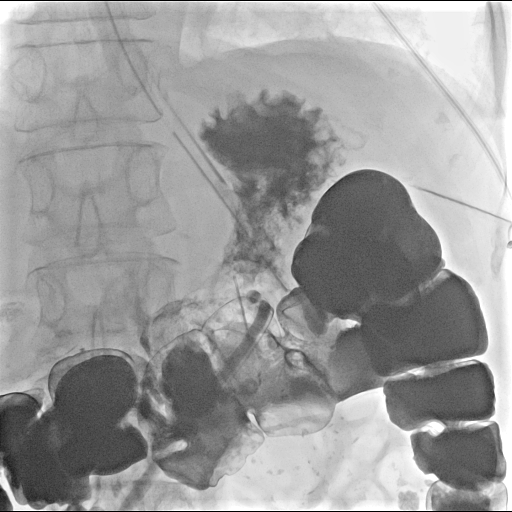

[2 of 2 positions shown; findings below may reference images not displayed]

PERCUTANEOUS GASTROSTOMY TUBE PLACEMENT

Sedation:  3.0 mg IV Versed; 125 mcg IV Fentanyl.

Total Moderate Sedation Time: 20 minutes.

Contrast:  20 ml Hmnipaque-CBB

Additional Medications:  The patient is on scheduled IV antibiotics
every 8 hours and did not receive additional antibiotics for the
procedure..

Fluoroscopy Time: 2.1 minutes.

Procedure:  The procedure, risks, benefits, and alternatives were
explained to the patient.  Questions regarding the procedure were
encouraged and answered.  The patient understands and consents to
the procedure.

The evening prior to the procedure, the patient was given thin
liquid barium to ingest in order to opacify the colon.  A 5-French
catheter was then advanced through the  patient's mouth under
fluoroscopy into the esophagus and to the level of the stomach.
This catheter was used to insufflate the stomach with air under
fluoroscopy.

The abdominal wall was prepped with Betadine in a sterile fashion,
and a sterile drape was applied covering the operative field.  A
sterile gown and sterile gloves were used for the procedure. Local
anesthesia was provided with 1% Lidocaine.

A skin incision was made in the upper abdominal wall.  Under
fluoroscopy, an 18 gauge trocar needle was advanced into the
stomach.  Contrast injection was performed to confirm intraluminal
position of the needle tip.  A single T tack was then deployed in
the lumen of the stomach.  This was brought up to tension at the
skin surface.

Over a guidewire, a 9-French sheath was advanced into the lumen of
the stomach.  The wire was left in place as a safety wire.  A loop
snare device from a percutaneous gastrostomy kit was then advanced
into the stomach.

A floppy guide wire was advanced through the orogastric catheter
under fluoroscopy in the stomach.  The loop snare advanced through
the percutaneous gastric access was used to snare the guide wire.
This allowed withdrawal of the loop snare out of the patient's
mouth by retraction of the orogastric catheter and wire.

A 20-French bumper retention gastrostomy tube was looped around the
snare device.  It was then pulled back through the patient's mouth.
The retention bumper was brought up to the anterior gastric wall.
The T tack suture was cut at the skin.  The exiting gastrostomy
tube was cut to appropriate length and a feeding adapter applied.
The catheter was injected with contrast material to confirm
position and a fluoroscopic spot image saved.  The tube was then
flushed with saline.  A dressing was applied over the gastrostomy
exit site.

Complications:  None
FINDINGS: Initial fluoroscopy demonstrates adequate opacification
of the colon by ingested barium in order to prevent colonic injury
during the procedure.  The stomach distended well with air allowing
safe placement of the gastrostomy tube.  After placement, the tip
of the gastrostomy tube lies in the body of the stomach.
IMPRESSION: Percutaneous gastrostomy with placement of a 20-French bumper
retention tube in the body of the stomach.  This tube can be used
for percutaneous feeds beginning in 24 hours after placement.
